# Patient Record
Sex: Female | Born: 1950 | Race: White | Hispanic: No | Marital: Single | Smoking: Never smoker
Health system: Southern US, Community
[De-identification: ages and names within clinical notes are randomized; demographics above are authoritative.]

## PROBLEM LIST (undated history)

## (undated) DIAGNOSIS — C801 Malignant (primary) neoplasm, unspecified: Secondary | ICD-10-CM

## (undated) DIAGNOSIS — A419 Sepsis, unspecified organism: Secondary | ICD-10-CM

## (undated) HISTORY — PX: CHOLECYSTECTOMY: SHX55

---

## 2016-03-31 ENCOUNTER — Emergency Department (HOSPITAL_COMMUNITY): Payer: Medicare Other

## 2016-03-31 ENCOUNTER — Inpatient Hospital Stay (HOSPITAL_COMMUNITY)
Admission: EM | Admit: 2016-03-31 | Discharge: 2016-04-05 | DRG: 690 | Disposition: A | Discharge status: Home / Self Care | Payer: Medicare Other | Attending: Family Medicine | Admitting: Family Medicine

## 2016-03-31 ENCOUNTER — Encounter (HOSPITAL_COMMUNITY): Payer: Self-pay

## 2016-03-31 DIAGNOSIS — N390 Urinary tract infection, site not specified: Principal | ICD-10-CM | POA: Diagnosis present

## 2016-03-31 DIAGNOSIS — M21371 Foot drop, right foot: Secondary | ICD-10-CM | POA: Diagnosis present

## 2016-03-31 DIAGNOSIS — A499 Bacterial infection, unspecified: Secondary | ICD-10-CM

## 2016-03-31 DIAGNOSIS — N189 Chronic kidney disease, unspecified: Secondary | ICD-10-CM | POA: Diagnosis present

## 2016-03-31 DIAGNOSIS — M549 Dorsalgia, unspecified: Secondary | ICD-10-CM | POA: Diagnosis present

## 2016-03-31 DIAGNOSIS — Y842 Radiological procedure and radiotherapy as the cause of abnormal reaction of the patient, or of later complication, without mention of misadventure at the time of the procedure: Secondary | ICD-10-CM | POA: Diagnosis present

## 2016-03-31 DIAGNOSIS — Z905 Acquired absence of kidney: Secondary | ICD-10-CM

## 2016-03-31 DIAGNOSIS — N131 Hydronephrosis with ureteral stricture, not elsewhere classified: Secondary | ICD-10-CM | POA: Diagnosis present

## 2016-03-31 DIAGNOSIS — N3000 Acute cystitis without hematuria: Secondary | ICD-10-CM | POA: Diagnosis not present

## 2016-03-31 DIAGNOSIS — Z936 Other artificial openings of urinary tract status: Secondary | ICD-10-CM

## 2016-03-31 DIAGNOSIS — Z1639 Resistance to other specified antimicrobial drug: Secondary | ICD-10-CM | POA: Diagnosis present

## 2016-03-31 DIAGNOSIS — B952 Enterococcus as the cause of diseases classified elsewhere: Secondary | ICD-10-CM | POA: Diagnosis present

## 2016-03-31 DIAGNOSIS — Z886 Allergy status to analgesic agent status: Secondary | ICD-10-CM

## 2016-03-31 DIAGNOSIS — R262 Difficulty in walking, not elsewhere classified: Secondary | ICD-10-CM

## 2016-03-31 DIAGNOSIS — Z1621 Resistance to vancomycin: Secondary | ICD-10-CM

## 2016-03-31 DIAGNOSIS — D649 Anemia, unspecified: Secondary | ICD-10-CM | POA: Diagnosis not present

## 2016-03-31 DIAGNOSIS — N1330 Unspecified hydronephrosis: Secondary | ICD-10-CM

## 2016-03-31 DIAGNOSIS — I959 Hypotension, unspecified: Secondary | ICD-10-CM | POA: Diagnosis present

## 2016-03-31 DIAGNOSIS — L8990 Pressure ulcer of unspecified site, unspecified stage: Secondary | ICD-10-CM | POA: Insufficient documentation

## 2016-03-31 DIAGNOSIS — Z923 Personal history of irradiation: Secondary | ICD-10-CM

## 2016-03-31 DIAGNOSIS — D631 Anemia in chronic kidney disease: Secondary | ICD-10-CM | POA: Diagnosis present

## 2016-03-31 DIAGNOSIS — A491 Streptococcal infection, unspecified site: Secondary | ICD-10-CM

## 2016-03-31 DIAGNOSIS — IMO0002 Reserved for concepts with insufficient information to code with codable children: Secondary | ICD-10-CM

## 2016-03-31 DIAGNOSIS — Z885 Allergy status to narcotic agent status: Secondary | ICD-10-CM

## 2016-03-31 DIAGNOSIS — Z9221 Personal history of antineoplastic chemotherapy: Secondary | ICD-10-CM

## 2016-03-31 DIAGNOSIS — G8929 Other chronic pain: Secondary | ICD-10-CM | POA: Diagnosis present

## 2016-03-31 DIAGNOSIS — Z882 Allergy status to sulfonamides status: Secondary | ICD-10-CM

## 2016-03-31 DIAGNOSIS — F419 Anxiety disorder, unspecified: Secondary | ICD-10-CM | POA: Diagnosis present

## 2016-03-31 DIAGNOSIS — Z8541 Personal history of malignant neoplasm of cervix uteri: Secondary | ICD-10-CM

## 2016-03-31 HISTORY — DX: Malignant (primary) neoplasm, unspecified: C80.1

## 2016-03-31 HISTORY — DX: Sepsis, unspecified organism: A41.9

## 2016-03-31 LAB — CBC WITH DIFFERENTIAL/PLATELET
BASOS PCT: 0 %
Basophils Absolute: 0 10*3/uL (ref 0.0–0.1)
Eosinophils Absolute: 0.4 10*3/uL (ref 0.0–0.7)
Eosinophils Relative: 4 %
HEMATOCRIT: 28.6 % — AB (ref 36.0–46.0)
HEMOGLOBIN: 8.8 g/dL — AB (ref 12.0–15.0)
LYMPHS ABS: 3.9 10*3/uL (ref 0.7–4.0)
Lymphocytes Relative: 38 %
MCH: 29.5 pg (ref 26.0–34.0)
MCHC: 30.8 g/dL (ref 30.0–36.0)
MCV: 96 fL (ref 78.0–100.0)
MONOS PCT: 9 %
Monocytes Absolute: 1 10*3/uL (ref 0.1–1.0)
NEUTROS ABS: 5 10*3/uL (ref 1.7–7.7)
NEUTROS PCT: 49 %
Platelets: 219 10*3/uL (ref 150–400)
RBC: 2.98 MIL/uL — AB (ref 3.87–5.11)
RDW: 14.6 % (ref 11.5–15.5)
WBC: 10.3 10*3/uL (ref 4.0–10.5)

## 2016-03-31 LAB — BASIC METABOLIC PANEL
Anion gap: 7 (ref 5–15)
BUN: 18 mg/dL (ref 6–20)
CHLORIDE: 105 mmol/L (ref 101–111)
CO2: 25 mmol/L (ref 22–32)
CREATININE: 1.7 mg/dL — AB (ref 0.44–1.00)
Calcium: 9.4 mg/dL (ref 8.9–10.3)
GFR calc non Af Amer: 30 mL/min — ABNORMAL LOW (ref >60)
GFR, EST AFRICAN AMERICAN: 35 mL/min — AB (ref >60)
Glucose, Bld: 78 mg/dL (ref 65–99)
POTASSIUM: 4.3 mmol/L (ref 3.5–5.1)
Sodium: 137 mmol/L (ref 135–145)

## 2016-03-31 LAB — URINE MICROSCOPIC-ADD ON: Squamous Epithelial / LPF: NONE SEEN

## 2016-03-31 LAB — I-STAT CG4 LACTIC ACID, ED
LACTIC ACID, VENOUS: 1.89 mmol/L (ref 0.5–1.9)
Lactic Acid, Venous: 1.54 mmol/L (ref 0.5–1.9)

## 2016-03-31 LAB — URINALYSIS, ROUTINE W REFLEX MICROSCOPIC
Bilirubin Urine: NEGATIVE
Glucose, UA: NEGATIVE mg/dL
Ketones, ur: NEGATIVE mg/dL
Nitrite: POSITIVE — AB
Protein, ur: 30 mg/dL — AB
SPECIFIC GRAVITY, URINE: 1.006 (ref 1.005–1.030)
pH: 6 (ref 5.0–8.0)

## 2016-03-31 MED ORDER — MIRTAZAPINE 15 MG PO TABS
15.0000 mg | ORAL_TABLET | Freq: Every day | ORAL | Status: DC
  Administered 2016-03-31 – 2016-04-03 (×4): 15 mg via ORAL
  Filled 2016-03-31 (×4): qty 1

## 2016-03-31 MED ORDER — OXYCODONE HCL 5 MG PO TABS
5.0000 mg | ORAL_TABLET | ORAL | Status: DC | PRN
  Administered 2016-03-31 – 2016-04-02 (×5): 5 mg via ORAL
  Filled 2016-03-31 (×5): qty 1

## 2016-03-31 MED ORDER — VITAMIN B-12 100 MCG PO TABS
100.0000 ug | ORAL_TABLET | Freq: Every day | ORAL | Status: DC
  Administered 2016-04-01 – 2016-04-05 (×5): 100 ug via ORAL
  Filled 2016-03-31 (×6): qty 1

## 2016-03-31 MED ORDER — ACETAMINOPHEN 650 MG RE SUPP: 650.0000 mg | Freq: Four times a day (QID) | RECTAL | Status: DC | PRN

## 2016-03-31 MED ORDER — GABAPENTIN 100 MG PO CAPS
200.0000 mg | ORAL_CAPSULE | Freq: Two times a day (BID) | ORAL | Status: DC
  Administered 2016-03-31 – 2016-04-05 (×10): 200 mg via ORAL
  Filled 2016-03-31 (×11): qty 2

## 2016-03-31 MED ORDER — HEPARIN SODIUM (PORCINE) 5000 UNIT/ML IJ SOLN
5000.0000 [IU] | Freq: Three times a day (TID) | INTRAMUSCULAR | Status: DC
  Administered 2016-03-31 – 2016-04-05 (×11): 5000 [IU] via SUBCUTANEOUS
  Filled 2016-03-31 (×12): qty 1

## 2016-03-31 MED ORDER — SODIUM CHLORIDE 0.9 % IV SOLN
INTRAVENOUS | Status: AC
  Administered 2016-03-31: 1000 mL via INTRAVENOUS
  Administered 2016-04-01: 05:00:00 via INTRAVENOUS

## 2016-03-31 MED ORDER — ALPRAZOLAM 0.5 MG PO TABS: 0.5000 mg | ORAL_TABLET | Freq: Three times a day (TID) | ORAL | Status: DC | PRN

## 2016-03-31 MED ORDER — FERROUS SULFATE 325 (65 FE) MG PO TABS
325.0000 mg | ORAL_TABLET | Freq: Every day | ORAL | Status: DC
  Administered 2016-04-01 – 2016-04-05 (×5): 325 mg via ORAL
  Filled 2016-03-31 (×6): qty 1

## 2016-03-31 MED ORDER — TEMAZEPAM 15 MG PO CAPS
30.0000 mg | ORAL_CAPSULE | Freq: Every day | ORAL | Status: DC
  Administered 2016-03-31 – 2016-04-04 (×5): 30 mg via ORAL
  Filled 2016-03-31 (×5): qty 2

## 2016-03-31 MED ORDER — SODIUM CHLORIDE 0.9% FLUSH
10.0000 mL | INTRAVENOUS | Status: DC | PRN
  Administered 2016-04-01 – 2016-04-04 (×2): 10 mL
  Filled 2016-03-31 (×2): qty 40

## 2016-03-31 MED ORDER — LOPERAMIDE HCL 2 MG PO CAPS
4.0000 mg | ORAL_CAPSULE | Freq: Three times a day (TID) | ORAL | Status: DC
  Administered 2016-03-31 – 2016-04-05 (×14): 4 mg via ORAL
  Filled 2016-03-31 (×17): qty 2

## 2016-03-31 MED ORDER — DEXTROSE 5 % IV SOLN
1.0000 g | Freq: Once | INTRAVENOUS | Status: AC
  Administered 2016-03-31: 1 g via INTRAVENOUS
  Filled 2016-03-31: qty 10

## 2016-03-31 MED ORDER — DEXTROSE 5 % IV SOLN
1.0000 g | INTRAVENOUS | Status: DC
  Administered 2016-04-01: 1 g via INTRAVENOUS
  Filled 2016-03-31 (×2): qty 10

## 2016-03-31 MED ORDER — SODIUM CHLORIDE 0.9 % IV BOLUS (SEPSIS)
1000.0000 mL | Freq: Once | INTRAVENOUS | Status: AC
Start: 2016-03-31 — End: 2016-04-01
  Administered 2016-03-31: 1000 mL via INTRAVENOUS

## 2016-03-31 MED ORDER — SODIUM BICARBONATE 650 MG PO TABS: 1300.0000 mg | ORAL_TABLET | Freq: Three times a day (TID) | ORAL | Status: DC

## 2016-03-31 MED ORDER — DIPHENOXYLATE-ATROPINE 2.5-0.025 MG PO TABS
1.0000 | ORAL_TABLET | Freq: Four times a day (QID) | ORAL | Status: DC | PRN
  Administered 2016-03-31 – 2016-04-05 (×4): 1 via ORAL
  Filled 2016-03-31 (×4): qty 1

## 2016-03-31 MED ORDER — ACETAMINOPHEN 325 MG PO TABS: 650.0000 mg | ORAL_TABLET | Freq: Four times a day (QID) | ORAL | Status: DC | PRN

## 2016-03-31 NOTE — ED Triage Notes (Signed)
Patient here with 1 day of cloudy urine and thinks this may be the start up of sepsis again. Daughter reports lengthy recent hospitalization for urosepsis. Patient alert and oriented, denies pain, no fever, no chiils

## 2016-03-31 NOTE — ED Provider Notes (Signed)
[facility] DEPT Provider Note   CSN: [id number] Arrival date & time: 03/31/16  1151     History   Chief Complaint Chief Complaint  Patient presents with  . cloudy urine/ hx of sepsis    HPI Rhonda Price is a 65 y.o. female.  The history is provided by the patient and medical records.    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 58 of history is provided by patient's daughter. Daughter reports that patient was hospitalized in [place] for about 2 months secondary to urosepsis. Patient received radiation and chemotherapy in the 1980s for cervical cancer. Subsequently, she has significant radiation damage to her urinary tract and distal colon. Because of this, she unfortunately had to undergo right nephrectomy and required stenting from the left kidney. During the hospital stay, her condition worsened and she ultimately required nephrostomy placement in the left kidney. Daughter reports she was just released from the hospital on 03/04/2016. Daughter relocated patient to [place] from [place] about one week ago. She did have her follow-up with a primary care doctor, however has not gotten her into see a urologist yet in the area. Daughter states she first started noticing the cloudy urine yesterday in the collection bag. She started her on ciprofloxacin which she was given a prescription of prior to leaving [place]. States today, cloudiness seems worse. Patient has not had any fever, nausea, or vomiting. Daughter does report that she appears somewhat weaker than normal. She has been eating and drinking regularly.  Patient does have port in chest wall.  Daughter states nephrostomy tubing is scheduled to be changed Q3 months, was just changed about a month ago while in hospital.  Past Medical History:  Diagnosis Date  . Cancer ([place])     . Sepsis ([place])     There are no active problems to display for this patient.   History reviewed. No pertinent surgical history.  OB History    No data available       Home Medications    Prior to Admission medications   Not on File    Family History No family history on file.  Social History Social History  Substance Use Topics  . Smoking status: Never Smoker  . Smokeless tobacco: Never Used  . Alcohol use Not on file     Allergies   Aspirin; Nsaids; Sulfa antibiotics; and Codeine   Review of Systems Review of Systems  Genitourinary:       Cloudy urine  All other systems reviewed and are negative.    Physical Exam Updated Vital Signs BP 105/66 (BP Location: Right Arm)   Pulse 89   Temp 98.4 F (36.9 C) (Oral)   Resp 18   Wt 51.3 kg   SpO2 99%   Physical Exam  Constitutional: She is oriented to person, place, and time. She appears well-developed and well-nourished.  Appears older than stated age, no distress, frail  HENT:  Head: Normocephalic and atraumatic.  Mouth/Throat: Oropharynx is clear and moist.  Eyes: Conjunctivae and EOM are normal. Pupils are equal, round, and reactive to light.  Neck: Normal range of motion.  Cardiovascular: Normal rate, regular rhythm and normal heart sounds.   Pulmonary/Chest: Effort normal and breath sounds normal. No respiratory distress. She has no wheezes.  Abdominal: Soft. Bowel sounds are normal. There is no tenderness. There is no rigidity and  no guarding.  Left nephrostomy tube in place; urine in tubing is very cloudy and almost milky appearing with large amount of sediment; urine in drainage bag appears yellow with sediment Nephrostomy site itself appears clean  Musculoskeletal: Normal range of motion.  Neurological: She is alert and oriented to person, place, and time.  Skin: Skin is warm and dry.  Psychiatric: She has a normal mood and affect.  Nursing note and vitals reviewed.    ED Treatments /  Results  Labs (all labs ordered are listed, but only abnormal results are displayed) Labs Reviewed  CBC WITH DIFFERENTIAL/PLATELET - Abnormal; Notable for the following:       Result Value   RBC 2.98 (*)    Hemoglobin 8.8 (*)    HCT 28.6 (*)    All other components within normal limits  BASIC METABOLIC PANEL - Abnormal; Notable for the following:    Creatinine, Ser 1.70 (*)    GFR calc non Af Amer 30 (*)    GFR calc Af Amer 35 (*)    All other components within normal limits  URINALYSIS, ROUTINE W REFLEX MICROSCOPIC (NOT AT [facility]) - Abnormal; Notable for the following:    APPearance CLOUDY (*)    Hgb urine dipstick SMALL (*)    Protein, ur 30 (*)    Nitrite POSITIVE (*)    Leukocytes, UA LARGE (*)    All other components within normal limits  URINE MICROSCOPIC-ADD ON - Abnormal; Notable for the following:    Bacteria, UA MANY (*)    All other components within normal limits  URINE CULTURE  CULTURE, BLOOD (ROUTINE X 2)  CULTURE, BLOOD (ROUTINE X 2)  I-STAT CG4 LACTIC ACID, ED  I-STAT CG4 LACTIC ACID, ED    EKG  EKG Interpretation None       Radiology Dg Chest 2 View  Result Date: 03/31/2016 CLINICAL DATA:  Patient with bloody urine. Concern for the possibility of urinary tract infection. EXAM: CHEST  2 VIEW COMPARISON:  None. FINDINGS: Port-A-Cath is present with tip projecting over the superior vena cava. Cardiac contours upper limits of normal. No large area of pulmonary consolidation. No pleural effusion or pneumothorax. Thoracic spine degenerative changes. IMPRESSION: No active cardiopulmonary disease. Electronically Signed   By: Lovey Newcomer M.D.   On: 03/31/2016 13:05    Procedures Procedures (including critical care time)  Medications Ordered in ED Medications - No data to display   Initial Impression / Assessment and Plan / ED Course  I have reviewed the triage vital signs and the nursing notes.  Pertinent labs & imaging results that were available during  my care of the patient were reviewed by me and considered in my medical decision making (see chart for details).  Clinical Course    65 y.o. F here with cloudy urine.  She has complex urologic history including right nephrectomy, left nephrostomy with recent two-month admission for urosepsis in [place].  Brought here 1 week ago to live with daughter who is POA.  Here patient is afebrile and nontoxic. She does appear older than her stated age and is frail. Her nephrostomy site appears clean, however there is very cloudy urine with large amount of sediment in the tubing and in the collection bag. Lactic acid and WBC count are normal.  SrCr 1.70, BUN normal.  No old values for comparison.  Urine here is nitrite + with many bacteria.  Blood and urine culture pending.  Patient given IVF and rocephin.  Case discussed  with family medicine teaching service, they will admit for ongoing care.  Have also spoken with urology, Dr. Diona Fanti-- he will come by and see patient later today to provide any recommendations which is much appreciated.  Final Clinical Impressions(s) / ED Diagnoses   Final diagnoses:  Urinary tract bacterial infections  Hx of nephrostomy  History of nephrectomy    New Prescriptions New Prescriptions   No medications on file     Larene Pickett, PA-C 03/31/16 1532    Daleen Bo, MD 03/31/16 Mena, MD 03/31/16 1552

## 2016-03-31 NOTE — ED Provider Notes (Signed)
  Face-to-face evaluation   History: Patient with mild weakness, and ill feeling for about a day. Her daughter became concerned because her urine output from the left nephrostomy tube was cloudy. No fever, vomiting. Complicated recent history. She has a single kidney.  Physical exam: Frail, elderly-appearing female. Heart regular rate and rhythm, no murmur. The pressure borderline low. Abdomen soft, nontender. Nephrostomy site left flank appears normal.  Medical screening examination/treatment/procedure(s) were conducted as a shared visit with non-physician practitioner(s) and myself.  I personally evaluated the patient during the encounter   Daleen Bo, MD 03/31/16 [phone]

## 2016-03-31 NOTE — Consult Note (Signed)
Urology Consult   Physician requesting consult: Sela Hua, MD  Reason for consult: Left nephrostomy tube with possible UTI  History of Present Illness: Rhonda Price is a 65 y.o. female with a long urologic history.  Initially, she underwent radiotherapy for cervical cancer in the 1970s.  She had significant complications, both from a bowel standpoint as well as ureteral obstruction.  She currently has a left percutaneous nephrostomy tube that, by report, was initially placed in the spring of 2017.  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  To manage her left ureteral stricture, she had recurrent stent placement on that side, until earlier this year, at which time a nephrostomy tube was placed.  Apparently, the last time this was changed was in late September 2017.  The patient was recently discharged in late October from a 2-1/2 month hospitalization in [place] for treatment of urosepsis.  Apparently, she was dialyzed for short period of time.  She recently moved to [place] to be near family.  Her daughter notes that she has been a little weaker than usual over the past day or 2, had a low-grade fever last night, and noted that the urine in the nephrostomy tubing appeared slightly cloudy.  She was brought to the hospital for further evaluation and management.  The patient currently denies any hematuria, flank pain, nausea or vomiting.  The patient's daughter states that she had several imaging studies while in the hospital in [place].  She does not have access to any of those or the discharge summary at this time.    Past Medical History:  Diagnosis Date  . Cancer ([place])   . Sepsis ([place])     History reviewed. No pertinent surgical history.   Current Hospital Medications: Scheduled Meds: . [START ON 04/01/2016] cefTRIAXone (ROCEPHIN)  IV  1 g Intravenous Q24H  . [START ON 04/01/2016] ferrous sulfate   325 mg Oral Q breakfast  . gabapentin  200 mg Oral BID  . heparin  5,000 Units Subcutaneous Q8H  . loperamide  4 mg Oral TID  . mirtazapine  15 mg Oral QHS  . temazepam  30 mg Oral QHS  . [START ON 04/01/2016] vitamin B-12  100 mcg Oral Daily   Continuous Infusions: . sodium chloride     PRN Meds:.acetaminophen **OR** acetaminophen, diphenoxylate-atropine, oxyCODONE, sodium chloride flush  Allergies:  Allergies  Allergen Reactions  . Aspirin Anaphylaxis  . Nsaids Anaphylaxis  . Sulfa Antibiotics Anaphylaxis  . Codeine Hives    No family history on file.  Social History:  reports that she has never smoked. She has never used smokeless tobacco. Her alcohol and drug histories are not on file.  ROS: A complete review of systems was performed.  All systems are negative except for pertinent findings as noted.  Physical Exam:  Vital signs in last 24 hours: Temp:  [98.4 F (36.9 C)] 98.4 F (36.9 C) (11/26 1157) Pulse Rate:  [72-89] 74 (11/26 1630) Resp:  [17-18] 17 (11/26 1545) BP: (98-113)/(52-66) 110/54 (11/26 1630) SpO2:  [98 %-99 %] 99 % (11/26 1630) Weight:  [51.3 kg (113 lb)] 51.3 kg (113 lb) (11/26 1159) General:  Alert and oriented, No acute distress HEENT: Normocephalic, atraumatic Neck: No JVD or lymphadenopathy Cardiovascular: Regular rate and rhythm Lungs: Clear bilaterally Abdomen: Soft, nontender, nondistended, no abdominal masses.  Nephrostomy tube is properly positioned in the left posterior flank area.  There is no tenderness surrounding this.  There is no purulent drainage. Back: No CVA tenderness Extremities: No edema Neurologic: Grossly intact  Laboratory Data:   Recent Labs  03/31/16 1340  WBC 10.3  HGB 8.8*  HCT 28.6*  PLT 219     Recent Labs  03/31/16 1340  NA 137  K 4.3  CL 105  GLUCOSE 78  BUN 18  CALCIUM 9.4  CREATININE 1.70*     Results for orders placed or performed during the hospital encounter of 03/31/16 (from the past  24 hour(s))  Urinalysis, Routine w reflex microscopic (not at [facility])     Status: Abnormal   Collection Time: 03/31/16 12:22 PM  Result Value Ref Range   Color, Urine YELLOW YELLOW   APPearance CLOUDY (A) CLEAR   Specific Gravity, Urine 1.006 1.005 - 1.030   pH 6.0 5.0 - 8.0   Glucose, UA NEGATIVE NEGATIVE mg/dL   Hgb urine dipstick SMALL (A) NEGATIVE   Bilirubin Urine NEGATIVE NEGATIVE   Ketones, ur NEGATIVE NEGATIVE mg/dL   Protein, ur 30 (A) NEGATIVE mg/dL   Nitrite POSITIVE (A) NEGATIVE   Leukocytes, UA LARGE (A) NEGATIVE  Urine microscopic-add on     Status: Abnormal   Collection Time: 03/31/16 12:22 PM  Result Value Ref Range   Squamous Epithelial / LPF NONE SEEN NONE SEEN   WBC, UA TOO NUMEROUS TO COUNT 0 - 5 WBC/hpf   RBC / HPF 6-30 0 - 5 RBC/hpf   Bacteria, UA MANY (A) NONE SEEN  CBC with Differential     Status: Abnormal   Collection Time: 03/31/16  1:40 PM  Result Value Ref Range   WBC 10.3 4.0 - 10.5 K/uL   RBC 2.98 (L) 3.87 - 5.11 MIL/uL   Hemoglobin 8.8 (L) 12.0 - 15.0 g/dL   HCT 28.6 (L) 36.0 - 46.0 %   MCV 96.0 78.0 - 100.0 fL   MCH 29.5 26.0 - 34.0 pg   MCHC 30.8 30.0 - 36.0 g/dL   RDW 14.6 11.5 - 15.5 %   Platelets 219 150 - 400 K/uL   Neutrophils Relative % 49 %   Neutro Abs 5.0 1.7 - 7.7 K/uL   Lymphocytes Relative 38 %   Lymphs Abs 3.9 0.7 - 4.0 K/uL   Monocytes Relative 9 %   Monocytes Absolute 1.0 0.1 - 1.0 K/uL   Eosinophils Relative 4 %   Eosinophils Absolute 0.4 0.0 - 0.7 K/uL   Basophils Relative 0 %   Basophils Absolute 0.0 0.0 - 0.1 K/uL  Basic metabolic panel     Status: Abnormal   Collection Time: 03/31/16  1:40 PM  Result Value Ref Range   Sodium 137 135 - 145 mmol/L   Potassium 4.3 3.5 - 5.1 mmol/L   Chloride 105 101 - 111 mmol/L   CO2 25 22 - 32 mmol/L   Glucose, Bld 78 65 - 99 mg/dL   BUN 18 6 - 20 mg/dL   Creatinine, Ser 1.70 (H) 0.44 - 1.00 mg/dL   Calcium 9.4 8.9 - 10.3 mg/dL   GFR calc non Af Amer 30 (L) >60 mL/min   GFR  calc Af Amer 35 (L) >60 mL/min   Anion gap 7 5 - 15  Blood culture (routine x 2)     Status: None (Preliminary result)   Collection Time: 03/31/16  1:40 PM  Result Value Ref Range   Specimen Description BLOOD    Special Requests BOTTLES DRAWN AEROBIC AND ANAEROBIC  5CC PORT    Culture PENDING  Report Status PENDING   I-Stat CG4 Lactic Acid, ED     Status: None   Collection Time: 03/31/16  2:01 PM  Result Value Ref Range   Lactic Acid, Venous 1.54 0.5 - 1.9 mmol/L  Urine culture     Status: None (Preliminary result)   Collection Time: 03/31/16  2:25 PM  Result Value Ref Range   Specimen Description URINE, RANDOM    Special Requests BAG PED    Culture PENDING    Report Status PENDING   I-Stat CG4 Lactic Acid, ED     Status: None   Collection Time: 03/31/16  3:47 PM  Result Value Ref Range   Lactic Acid, Venous 1.89 0.5 - 1.9 mmol/L   Recent Results (from the past 240 hour(s))  Blood culture (routine x 2)     Status: None (Preliminary result)   Collection Time: 03/31/16  1:40 PM  Result Value Ref Range Status   Specimen Description BLOOD  Final   Special Requests BOTTLES DRAWN AEROBIC AND ANAEROBIC  5CC PORT  Final   Culture PENDING  Incomplete   Report Status PENDING  Incomplete  Urine culture     Status: None (Preliminary result)   Collection Time: 03/31/16  2:25 PM  Result Value Ref Range Status   Specimen Description URINE, RANDOM  Final   Special Requests BAG PED  Final   Culture PENDING  Incomplete   Report Status PENDING  Incomplete    Renal Function:  Recent Labs  03/31/16 1340  CREATININE 1.70*   CrCl cannot be calculated (Unknown ideal weight.).  Radiologic Imaging: Dg Chest 2 View  Result Date: 03/31/2016 CLINICAL DATA:  Patient with bloody urine. Concern for the possibility of urinary tract infection. EXAM: CHEST  2 VIEW COMPARISON:  None. FINDINGS: Port-A-Cath is present with tip projecting over the superior vena cava. Cardiac contours upper limits  of normal. No large area of pulmonary consolidation. No pleural effusion or pneumothorax. Thoracic spine degenerative changes. IMPRESSION: No active cardiopulmonary disease. Electronically Signed   By: Lovey Newcomer M.D.   On: 03/31/2016 13:05    I independently reviewed the above imaging studies.  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  Possible urinary tract infection.  The patient has a recent history of urosepsis, treated during a 2-1/2 month long hospitalization in [place], recently discharged in late October.  It is important to make sure the patient does not have any renal calculi or other renal abnormalities that would predispose her to have these infections.  Plan:  1.  I have asked the patient's daughter to obtain radiographic imaging studies done while in her hospital stay in [place].  2.  It has been approximately 2 months since she had her nephrostomy tube changed-I would strongly recommend that this be done during her hospitalization here.  I will have that procedure ordered.  3.  I spoke with the patient and her daughter about proper antibiotic management of pyuria.  With her having her left kidney drained externally, there will always be bacteria in the tubing/urine, similar to a Foley catheter being in the bladder for several days.  It will be judicious to not over treat pyuria, as this will obviously cause more resistant bacteria to grow out.  4.  I would treat her current pyuria with adequate antibiotics tailored to the bacterial species/sensitivities  5.  She will need occasional follow-up in our office.-We will get that arranged.  6.  Imaging  of her renal collecting system will be adequately  done, hopefully during her interventional radiology visit.  45 minutes were spent with the patient face-to-face, as well as speaking with the patient's daughter.  6.

## 2016-03-31 NOTE — H&P (Signed)
[facility] Admission History and Physical Service Pager: [phone]  Patient name: Rhonda Price Medical record number: [id number] Date of birth: 08/20/1950 Age: 65 y.o. Gender: female  Primary Care Provider: Thurman Coyer, MD Consultants: Urology Code Status: Full  Chief Complaint: "Milky/cloudy urine"  Assessment and Plan: Taina Dysert is a 65 y.o. female presenting with UTI. PMH is significant for hx cervical cancer in the 70s, radiation colitis w/ chronic diarrhea, chronic back pain w/ lumbar rods, hx R nephrectomy, L nephrostomy tube.  Urinary Tract Infection: Pt has a significant urologic history 2/2 to urinary tract damage after receiving radiation for cervical cancer in the 70s. She is s/p R nephrectomy after numerous infections. She currently has a nephrostomy tube in place on the L. She was just recently hospitalized for urosepsis. She has been having cloudy urine since yesterday, low grade fever to 99, and "has been feeling strange". In the ED, she was afebrile with a normal HR. WBC 10.8. Pt not meeting SIRS criteria. Lactic acid was 1.54 (although Pt taking sodium bicarb at home). UA was consistent with infection with many bacteria, large LE, positive nitrites, TNTC WBCs.  - Admit to med-surg under observation status, attending Dr. Gwendlyn Deutscher. - Urology consulted, appreciate recommendations - Continue Ceftriaxone - Blood and urine cultures pending - Monitor I/Os - Repeat AM CBC and BMET - Vitals per unit routine - Tylenol for fever  Hypotension: Pt with a BP to 98/52 in the ED, improved to 110/54 after a 1L NS bolus. - Will monitor blood pressures closely and will give another bolus if needed - MIVFs at 67ml/hr  Elevated Creatinine in CKD: Cr 1.70 in the ED. No baseline for comparison. Required HD x 3 at last hospitalization for acute renal failure. 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 - Continue home meds: Lomotil 1 tablet qid prn, and Imodium 4mg  tid  Normocytic Anemia: Denies hematochezia or hematuria. Likely related to chronic kidney disease. - Continue home meds: ferrous sulfate 325mg  qd and Vitamin B12 185mcg daily - Repeat AM CBC  Chronic Back Pain: s/p lumbar spinal surgery in 2015 with lumbar rods placed. 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 - Continue home meds: Gabapentin 200mg  bid, Oxycodone 5mg  q4hrs prn  Anxiety/Insomia: - Continue home meds: Temazepam qhs and Remeron 15mg  qhs  - Holding home Xanax because she is already taking Temazepam  FEN/GI: Regular diet Prophylaxis: Heparin sq  Disposition: Admit to med-surg under observation status. Anticipate discharge home on 11/27 or 11/28 pending urology recommendations.  History of Present Illness:  Rhonda Price is a 65 y.o. female presenting with milky/cloudy urine and not feeling like herself starting this morning. History is provided by the daughter. In the 70s, Pt had cervical cancer. She had radiology pellets placed for treatment, and this lead to chronic problems with her urinary tract and bowel. 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 The radiation also caused her to have problems with her urinary tract. She had a ureter stent in her right kidney (2011) and had too many infections and too much kidney damage. Her right kidney was then removed in 2014. She had a stent placed in her left kidney this year. A few months after the stent was placed, she was hospitalized for 2.5 months for urosepsis.  She also went into acute renal failure and had to receive HD x 3. In the hospital, they initially replaced the sten, but then decided to remove the stent and place a nephrostomy tube instead. At that hospitalization, she also had her gallbladder removed. She went to inpatient  rehab and then moved from [place] to [place] to be closer to her daughter.  Yesterday morning, her daughter noticed that her nephrostomy tube had sediment in it and she noticed that her urine started looking milky. She had a temperature to 83F. Patient states started feeling "strange" this morning. She denies feeling feverish or having chills. No nausea, no vomiting. Today, she also had some pain of her left flank. It felt like "pulling stitches". She has no other complaints.  In the ED, Pt was afebrile with a normal HR. She had a low BP to 98/52 and was given a 1L bolus of NS. Her BP improved to 113/53. Her labs were significant for Cr 1.70 (no baseline for comparison), WBC 10.3, Hgb 8.8. Lactic acid 1.54. UA with many bacteria, small Hgb, lrg LE, positive nitrites, 30 protein, and TNTC WBCs. Blood and urine cultures were performed. Pt received CTX x 1. Urology was consulted by the ED. She was admitted for further management.  Review Of Systems: Per HPI with the following additions: see below  Review of Systems  Constitutional: Positive for malaise/fatigue. Negative for chills and fever.  HENT: Negative for congestion, hearing loss and sore throat.   Eyes: Positive for blurred vision. Negative for double vision.  Respiratory: Negative for cough and shortness of breath.   Cardiovascular: Negative for chest pain and leg swelling.  Gastrointestinal: Positive for diarrhea. Negative for abdominal pain, constipation, nausea and vomiting.  Genitourinary: Positive for flank pain. Negative for hematuria.  Musculoskeletal: Positive for back pain.  Skin: Negative for rash.  Neurological: Negative for dizziness and headaches.    Patient Active Problem List   Diagnosis Date Noted  . UTI (urinary tract infection) 03/31/2016    Past Medical History: Past Medical History:  Diagnosis Date  . Cancer ([place])   . Sepsis [facility])     Past Surgical History: -Cholecystectomy- 2017 -Lumbar spine surgery-  2015 -Nephrectomy R- 2014 -Nephrostomy L- 2017 -Hysterectomy  Social History: Social History  Substance Use Topics  . Smoking status: Never Smoker  . Smokeless tobacco: Never Used  . Alcohol use Not on file   Additional social history: Just moved from [place] to be closer to daughter. Please also refer to relevant sections of EMR.  Family History: -No kidney problems run in the family -Daughter is healthy without any medical conditions  Allergies and Medications: Allergies  Allergen Reactions  . Aspirin Anaphylaxis  . Nsaids Anaphylaxis  . Sulfa Antibiotics Anaphylaxis  . Codeine Hives   No current facility-administered medications on file prior to encounter.    No current outpatient prescriptions on file prior to encounter.    Objective: BP (!) 113/53   Pulse 86   Temp 98.4 F (36.9 C) (Oral)   Resp 17   Wt 113 lb (51.3 kg)   SpO2 99%  Exam: General: Frail, tired-appearing, intermittently tearful, answers questions Eyes: PERRLA, EOMI ENTM: Nose normal, oropharynx clear, MMM Neck: Supple, no thyromegaly, no cervical lymphadenopathy Cardiovascular: RRR, no murmurs, 2+ DP pulses Respiratory: CTAB, normal work of breathing, no wheezes or crackles Gastrointestinal: +BS, soft, non-tender, non-distended, old well-healed scar from previous PEG tube present in LUQ GU: Left nephrostomy tube intact with no surrounding edema, urine  in collecting bag is cloudy and yellow, sediment present in the nephrostomy tubing. MSK: No edema, warm and well-perfused Derm: No rashes or lesions Neuro: Awake, alert, oriented, +R foot drop, sensation intact bilaterally. Psych: Normal behavior, appropriate affect, occasionally tearful about being admitted.  Labs and Imaging: CBC BMET   Recent Labs Lab 03/31/16 1340  WBC 10.3  HGB 8.8*  HCT 28.6*  PLT 219    Recent Labs Lab 03/31/16 1340  NA 137  K 4.3  CL 105  CO2 25  BUN 18  CREATININE 1.70*  GLUCOSE 78  CALCIUM 9.4      CXR- neg  Sela Hua, MD 03/31/2016, 4:31 PM PGY-2, [place] Intern pager: [phone], text pages welcome

## 2016-03-31 NOTE — Progress Notes (Signed)
Cutina Deavila [id number] Admitted to [id number]: 03/31/2016 1930 Attending Provider: Kinnie Feil, MD    Rhonda Price is a 65 y.o. female patient admitted from ED awake, alert  & orientated  X 3,  Full Code, VSS - Blood pressure (!) 119/53, pulse 80, temperature 97.9 F (36.6 C), temperature source Oral, resp. rate 18, height 5\' 4"  (1.626 m), weight 51.3 kg (113 lb), SpO2 99 %., R/A, no c/o shortness of breath, no c/o chest pain, no distress noted. Non Tele placed.:g.   IV site WDL:  Left porta cath with a transparent dsg that's clean dry and intact.  Allergies:   Allergies  Allergen Reactions  . Aspirin Anaphylaxis  . Nsaids Anaphylaxis  . Sulfa Antibiotics Anaphylaxis  . Codeine Hives     Past Medical History:  Diagnosis Date  . Cancer ([place])   . Sepsis ([place])     History:  Will be obtained from the patient.  Pt orientation to unit, room and routine. Information packet given to patient/family and safety video watched.  Admission INP armband ID verified with patient/family, and in place. SR up x 2, fall risk assessment complete with Patient and family verbalizing understanding of risks associated with falls. Pt verbalizes an understanding of how to use the call bell and to call for help before getting out of bed.  Skin, clean-dry- intact without evidence of bruising, or skin tears.   No evidence of skin break down noted on exam, stage 1 to sacrum.    Will cont to monitor and assist as needed.  Lindalou Hose, RN 03/31/2016 8:32 PM

## 2016-04-01 ENCOUNTER — Encounter (HOSPITAL_COMMUNITY): Payer: Self-pay | Admitting: Interventional Radiology

## 2016-04-01 ENCOUNTER — Observation Stay (HOSPITAL_COMMUNITY): Payer: Medicare Other

## 2016-04-01 DIAGNOSIS — D631 Anemia in chronic kidney disease: Secondary | ICD-10-CM | POA: Diagnosis present

## 2016-04-01 DIAGNOSIS — N390 Urinary tract infection, site not specified: Secondary | ICD-10-CM | POA: Diagnosis present

## 2016-04-01 DIAGNOSIS — I959 Hypotension, unspecified: Secondary | ICD-10-CM | POA: Diagnosis present

## 2016-04-01 DIAGNOSIS — Z87448 Personal history of other diseases of urinary system: Secondary | ICD-10-CM | POA: Diagnosis not present

## 2016-04-01 DIAGNOSIS — G8929 Other chronic pain: Secondary | ICD-10-CM | POA: Diagnosis present

## 2016-04-01 DIAGNOSIS — Y842 Radiological procedure and radiotherapy as the cause of abnormal reaction of the patient, or of later complication, without mention of misadventure at the time of the procedure: Secondary | ICD-10-CM | POA: Diagnosis present

## 2016-04-01 DIAGNOSIS — Z1621 Resistance to vancomycin: Secondary | ICD-10-CM | POA: Diagnosis not present

## 2016-04-01 DIAGNOSIS — M549 Dorsalgia, unspecified: Secondary | ICD-10-CM | POA: Diagnosis present

## 2016-04-01 DIAGNOSIS — Z8541 Personal history of malignant neoplasm of cervix uteri: Secondary | ICD-10-CM | POA: Diagnosis not present

## 2016-04-01 DIAGNOSIS — B952 Enterococcus as the cause of diseases classified elsewhere: Secondary | ICD-10-CM | POA: Diagnosis present

## 2016-04-01 DIAGNOSIS — Z885 Allergy status to narcotic agent status: Secondary | ICD-10-CM | POA: Diagnosis not present

## 2016-04-01 DIAGNOSIS — M21371 Foot drop, right foot: Secondary | ICD-10-CM | POA: Diagnosis present

## 2016-04-01 DIAGNOSIS — Z1639 Resistance to other specified antimicrobial drug: Secondary | ICD-10-CM | POA: Diagnosis present

## 2016-04-01 DIAGNOSIS — A491 Streptococcal infection, unspecified site: Secondary | ICD-10-CM | POA: Diagnosis not present

## 2016-04-01 DIAGNOSIS — IMO0002 Reserved for concepts with insufficient information to code with codable children: Secondary | ICD-10-CM

## 2016-04-01 DIAGNOSIS — Z9221 Personal history of antineoplastic chemotherapy: Secondary | ICD-10-CM | POA: Diagnosis not present

## 2016-04-01 DIAGNOSIS — N3000 Acute cystitis without hematuria: Secondary | ICD-10-CM | POA: Diagnosis not present

## 2016-04-01 DIAGNOSIS — Z936 Other artificial openings of urinary tract status: Secondary | ICD-10-CM | POA: Diagnosis not present

## 2016-04-01 DIAGNOSIS — Z882 Allergy status to sulfonamides status: Secondary | ICD-10-CM | POA: Diagnosis not present

## 2016-04-01 DIAGNOSIS — N189 Chronic kidney disease, unspecified: Secondary | ICD-10-CM | POA: Diagnosis present

## 2016-04-01 DIAGNOSIS — Z905 Acquired absence of kidney: Secondary | ICD-10-CM | POA: Diagnosis not present

## 2016-04-01 DIAGNOSIS — Z923 Personal history of irradiation: Secondary | ICD-10-CM | POA: Diagnosis not present

## 2016-04-01 DIAGNOSIS — N131 Hydronephrosis with ureteral stricture, not elsewhere classified: Secondary | ICD-10-CM | POA: Diagnosis present

## 2016-04-01 DIAGNOSIS — F419 Anxiety disorder, unspecified: Secondary | ICD-10-CM | POA: Diagnosis present

## 2016-04-01 DIAGNOSIS — Z886 Allergy status to analgesic agent status: Secondary | ICD-10-CM | POA: Diagnosis not present

## 2016-04-01 DIAGNOSIS — L8990 Pressure ulcer of unspecified site, unspecified stage: Secondary | ICD-10-CM | POA: Diagnosis present

## 2016-04-01 HISTORY — PX: IR GENERIC HISTORICAL: IMG1180011

## 2016-04-01 LAB — BASIC METABOLIC PANEL
Anion gap: 5 (ref 5–15)
BUN: 19 mg/dL (ref 6–20)
CHLORIDE: 114 mmol/L — AB (ref 101–111)
CO2: 23 mmol/L (ref 22–32)
CREATININE: 1.67 mg/dL — AB (ref 0.44–1.00)
Calcium: 8.8 mg/dL — ABNORMAL LOW (ref 8.9–10.3)
GFR, EST AFRICAN AMERICAN: 36 mL/min — AB (ref >60)
GFR, EST NON AFRICAN AMERICAN: 31 mL/min — AB (ref >60)
Glucose, Bld: 86 mg/dL (ref 65–99)
POTASSIUM: 4.4 mmol/L (ref 3.5–5.1)
SODIUM: 142 mmol/L (ref 135–145)

## 2016-04-01 LAB — URINE CULTURE

## 2016-04-01 LAB — CBC
HCT: 26.3 % — ABNORMAL LOW (ref 36.0–46.0)
Hemoglobin: 8.2 g/dL — ABNORMAL LOW (ref 12.0–15.0)
MCH: 30.3 pg (ref 26.0–34.0)
MCHC: 31.2 g/dL (ref 30.0–36.0)
MCV: 97 fL (ref 78.0–100.0)
PLATELETS: 188 10*3/uL (ref 150–400)
RBC: 2.71 MIL/uL — ABNORMAL LOW (ref 3.87–5.11)
RDW: 15 % (ref 11.5–15.5)
WBC: 7.4 10*3/uL (ref 4.0–10.5)

## 2016-04-01 MED ORDER — LIDOCAINE HCL 1 % IJ SOLN
INTRAMUSCULAR | Status: DC | PRN
  Administered 2016-04-01: 5 mL

## 2016-04-01 MED ORDER — SODIUM CHLORIDE 0.9 % IV BOLUS (SEPSIS)
1000.0000 mL | Freq: Once | INTRAVENOUS | Status: AC
  Administered 2016-04-01: 1000 mL via INTRAVENOUS

## 2016-04-01 MED ORDER — LIDOCAINE HCL 1 % IJ SOLN
INTRAMUSCULAR | Status: AC
  Filled 2016-04-01: qty 20

## 2016-04-01 MED ORDER — SODIUM BICARBONATE 650 MG PO TABS
1300.0000 mg | ORAL_TABLET | Freq: Three times a day (TID) | ORAL | Status: DC
  Administered 2016-04-01 – 2016-04-05 (×13): 1300 mg via ORAL
  Filled 2016-04-01 (×13): qty 2

## 2016-04-01 MED ORDER — IOPAMIDOL (ISOVUE-300) INJECTION 61%
INTRAVENOUS | Status: AC
  Administered 2016-04-01: 15 mL
  Filled 2016-04-01: qty 50

## 2016-04-01 NOTE — Progress Notes (Signed)
Family Medicine Teaching Service Daily Progress Note Intern Pager: [phone]  Patient name: Rhonda Price Medical record number: [id number] Date of birth: 1951/02/11 Age: 65 y.o. Gender: female  Primary Care Provider: Thurman Coyer, MD Consultants: Urology Code Status: Full  Pt Overview and Major Events to Date:  11/26 admit for UTI-like symptoms 11/27 plan for nephrostomy tube change by IR  Assessment and Plan: Cortasia Khouri is a 65 y.o. female presenting with UTI. PMH is significant for hx cervical cancer in the 70s, radiation colitis w/ chronic diarrhea, chronic back pain w/ lumbar rods, hx R nephrectomy, L nephrostomy tube.  Likely Urinary Tract Infection: Significant urologic history 2/2 to urinary tract damage s/p radiation for cervical cancer in 1970s. S/p R nephrectomy, S/p L nephrostomy tube placed this year. P/w cloudy urine, sediment in neph tube, leukocytosis 10.8>>7.8, LA 1.54. UA with many bacteria, large LE, positive nitrites, TNTC WBCs. Cr 1.7>>1.67 -  Plan for nephrostomy tube change by IR today - Continue Day #2 Ceftriaxone (11/26 - ), will narrow based on UCx  - F/u BCx, UCx - Monitor I/Os - 900 mL UOP yesterday - Vitals per unit routine - Tylenol PRN fever - appreciate urology recs  Hypotension: Pt with a BP to 98/52 in the ED, improved to 110/54 after a 1L NS bolus. - 99-119/36-61 overnight.  This morning paged patient's BP low at 93 systolic, improved to [id number] s/p 1000 ml bolus.  - will repeat 1000 ml bolus and recheck pressure - Will monitor blood pressures closely after bolus - MIVFs at 73ml/hr  Elevated Creatinine in CKD: Cr 1.70>>1.67. No baseline for comparison. Required HD x 3 at last hospitalization for acute renal failure. - s/p 1L NS bolus in the ED, continue mIVF - Avoid nephrotoxic agents - monitor BMP - Has outpatient f/u w/ Nephrology - on bicarb chronically at home, thought to be for  RTA will restart home dose this AM 1300 mg TID. 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 - Continue home meds: Lomotil 1 tablet qid prn, and Imodium 4mg  tid  Normocytic Anemia: Denies hematochezia or hematuria. Likely related to chronic kidney disease. - Continue home meds: ferrous sulfate 325mg  qd and Vitamin B12 148mcg daily - Repeat AM CBC  Chronic Back Pain: s/p lumbar spinal surgery in 2015 with lumbar rods placed. 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 - Continue home meds: Gabapentin 200mg  bid, Oxycodone 5mg  q4hrs prn  Anxiety/Insomia: - Continue home meds: Temazepam qhs and Remeron 15mg  qhs  - Holding home Xanax because she is already taking Temazepam  FEN/GI: Regular diet Prophylaxis: Heparin sq  Disposition: Home pending clincial improvement   Subjective:  Patient tremulous, states she has to use the restroom. Notes 2 episodes of diarrhea in past day (consistent with chronic diarrhea).  Will need to speak with patient further, urgently needs to use the restroom this AM when I visited her.  Objective: Temp:  [97.6 F (36.4 C)-98.4 F (36.9 C)] 98 F (36.7 C) (11/27 1308) Pulse Rate:  [71-86] 75 (11/27 1308) Resp:  [14-18] 14 (11/27 1308) BP: (93-119)/(36-61) 101/43 (11/27 1308) SpO2:  [97 %-100 %] 100 % (11/27 1308) Weight:  [51.3 kg (113 lb)] 51.3 kg (113 lb) (11/26 1927) Physical Exam: General: NAD, pale, frail-looking, trying to get out of bed to use commode Cardiovascular: RRR, no m/r/g,  Respiratory: CTA bil, no W/R/R Abdomen: soft and nontender, +nephrostomy site clean and dry  Extremities: No rashes or lesions Neuro: +tremulousness generalized on exam  Laboratory:  Recent Labs Lab 03/31/16 1340 04/01/16 0444  WBC 10.3 7.4  HGB 8.8* 8.2*  HCT 28.6* 26.3*  PLT 219 188    Recent Labs Lab 03/31/16 1340 04/01/16 0444  NA 137 142  K 4.3 4.4  CL 105 114*  CO2 25 23  BUN 18 19  CREATININE 1.70* 1.67*  CALCIUM 9.4 8.8*   GLUCOSE 78 86      Imaging/Diagnostic Tests:   Everrett Coombe, MD 04/01/2016, 2:14 PM PGY-1, [place] Intern pager: [phone], text pages welcome

## 2016-04-01 NOTE — Care Management Obs Status (Signed)
Hall NOTIFICATION   Patient Details  Name: Rhonda Price MRN: [id number] Date of Birth: 25-Oct-1950   Medicare Observation Status Notification Given:  Yes    Carles Collet, RN 04/01/2016, 11:02 AM

## 2016-04-01 NOTE — Progress Notes (Signed)
Patient's BP 93/42, MD notified. Bolus ordered. Will continue to monitor.

## 2016-04-01 NOTE — Procedures (Signed)
Interventional Radiology Procedure Note  Procedure: Routine exchange of left PCN.  New 1F tube.   Findings:  Distal ureteral obstruction .  Complications: None  Recommendations:  - To gravity drain - Routine exchange in 2-3 months. - Routine care  Signed,  Dulcy Fanny. Earleen Newport, DO

## 2016-04-01 NOTE — Care Management Note (Signed)
Case Management Note  Patient Details  Name: Alta Dewilde MRN: [id number] Date of Birth: Sep 13, 1950  Subjective/Objective:                 Patient from home with daughter. In obs for UTI, mentation clear, will have nephrostomy changed today per nursing report. Patient denies barriers to follow up care. PCP Dr. Thea Silversmith   Action/Plan:  DC to home, no CM needs identified.  Expected Discharge Date:                  Expected Discharge Plan:  Home/Self Care  In-House Referral:     Discharge planning Services  CM Consult  Post Acute Care Choice:    Choice offered to:     DME Arranged:    DME Agency:     HH Arranged:    HH Agency:     Status of Service:  In process, will continue to follow  If discussed at Long Length of Stay Meetings, dates discussed:    Additional Comments:  Carles Collet, RN 04/01/2016, 11:02 AM

## 2016-04-02 LAB — CBC
HEMATOCRIT: 26.3 % — AB (ref 36.0–46.0)
HEMOGLOBIN: 8.2 g/dL — AB (ref 12.0–15.0)
MCH: 30 pg (ref 26.0–34.0)
MCHC: 31.2 g/dL (ref 30.0–36.0)
MCV: 96.3 fL (ref 78.0–100.0)
Platelets: 208 10*3/uL (ref 150–400)
RBC: 2.73 MIL/uL — AB (ref 3.87–5.11)
RDW: 14.4 % (ref 11.5–15.5)
WBC: 7.7 10*3/uL (ref 4.0–10.5)

## 2016-04-02 MED ORDER — CEFDINIR 125 MG/5ML PO SUSR: 300.0000 mg | Freq: Two times a day (BID) | ORAL | Status: DC

## 2016-04-02 MED ORDER — CEFDINIR 125 MG/5ML PO SUSR
300.0000 mg | Freq: Two times a day (BID) | ORAL | Status: DC
  Administered 2016-04-02 – 2016-04-03 (×3): 300 mg via ORAL
  Filled 2016-04-02 (×5): qty 15

## 2016-04-02 MED ORDER — NORTRIPTYLINE HCL 25 MG PO CAPS
25.0000 mg | ORAL_CAPSULE | Freq: Every day | ORAL | Status: DC
  Administered 2016-04-02 – 2016-04-03 (×2): 25 mg via ORAL
  Filled 2016-04-02 (×2): qty 1

## 2016-04-02 MED ORDER — SODIUM CHLORIDE 0.45 % IV SOLN
INTRAVENOUS | Status: DC
  Administered 2016-04-02 – 2016-04-04 (×3): via INTRAVENOUS

## 2016-04-02 NOTE — Progress Notes (Signed)
Family Medicine Teaching Service Daily Progress Note Intern Pager: [phone]  Patient name: Rhonda Price Medical record number: [id number] Date of birth: 11-Feb-1951 Age: 65 y.o. Gender: female  Primary Care Provider: Thurman Coyer, MD Consultants: Urology Code Status: Full  Pt Overview and Major Events to Date:  11/26 admit for UTI-like symptoms 11/27 nephrostomy tube change by IR  Assessment and Plan: Rhonda Price is a 65 y.o. female presenting with UTI. PMH is significant for hx cervical cancer in the 70s, radiation colitis w/ chronic diarrhea, chronic back pain w/ lumbar rods, hx R nephrectomy, L nephrostomy tube.  Likely Urinary Tract Infection: Significant urologic history 2/2 to urinary tract damage s/p radiation for cervical cancer in 1970s. S/p R nephrectomy, S/p L nephrostomy tube placed this year. Hx of urosepsis warranting a prolonged hospitalization in [place] earlier this year. P/w increased urinary frequency, cloudy urine, sediment in neph tube, leukocytosis 10.8>>7.8 (pending today) LA 1.54. UA with many bacteria, large LE, positive nitrites, TNTC WBCs. Cr 1.7>>1.67 -  S/p nephrostomy tube change by IR (11/27) - S/p two days Ceftriaxone (11/26 -11/27 ), will transition to [facility] today and monitor overnight. - F/u BCx NGTD, UCx recollected. Patient admits to taking one day of ciprofloxacin prior to coming to hospital - Monitor I/Os - 1250 mL UOP yesterday - Tylenol PRN fever - appreciate urology recs  Hypotension: Pressures low since admit, patient s/p 2 1L boluses yesterday with pressures minimally improved to101/43 afterwards.  This AM BP low at 88/40. Patient indicates previous urosepsis episode she was afebrile and hypotensive - monitor BP closely - MIVFs at 61ml/hr - low threshold to escalate care if necessary  Elevated Creatinine in CKD: Cr 1.70>>1.67 >>(pending today). No baseline for comparison. Required HD x 3 at last hospitalization for acute renal  failure. - s/p 3L NS bolus since admit, continue mIVF - Avoid nephrotoxic agents - monitor BMP, will follow up today's Cr when it is resulted - Has outpatient f/u w/ Nephrology - continue home bicarb 1300 mg TID.  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 Notes ~3 episodes yesterday, baseline 10-15 episodes daily at home - Continue home meds: Lomotil 1 tablet qid prn, and Imodium 4mg  tid - add nortryptyline 25 mg qhs for further diarrhea control  Normocytic Anemia: Denies hematochezia or hematuria. Likely related to chronic kidney disease. - Continue home meds: ferrous sulfate 325mg  qd and Vitamin B12 15mcg daily -  Will follow up AM CBC once resulted  Chronic Back Pain: s/p lumbar spinal surgery in 2015 with lumbar rods placed. 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 - Continue home meds: Gabapentin 200mg  bid, Oxycodone 5mg  q4hrs prn  Anxiety/Insomia: - Continue home meds: Temazepam qhs and Remeron 15mg  qhs  - Holding home Xanax because she is already taking Temazepam  FEN/GI: Regular diet, 1/2 NS at 1/2 maintenance Prophylaxis: Heparin sq  Disposition: Home pending clincial improvement   Subjective:  Patient was hypotensive early this AM with BP 88/44, MAP 59.  Patient was sleeping, was not bolused again. Blood pressure improved this AM to 91/46.  Patient eating breakfast when I saw her this AM. Stoic affect, but no complaints.  No N/V/D/C.    Objective: Temp:  [98.1 F (36.7 C)-98.6 F (37 C)] 98.4 F (36.9 C) (11/28 0429) Pulse Rate:  [65-72] 65 (11/28 0429) Resp:  [18-20] 20 (11/28 0429) BP: (88-111)/(40-46) 91/46 (11/28 0730) SpO2:  [92 %-99 %] 92 % (11/28 0429) Physical Exam: General: NAD, pale, frail-looking, sits  in bed eating breakfast Cardiovascular: RRR, no m/r/g,  Respiratory: CTA bil, no W/R/R Abdomen: soft and nontender, +nephrostomy site bandaged clean and dry Extremities: No rashes or lesions Neuro: CN II-XII  Grossly intact Psych: affect flat, thought process linear, AAOx3  Laboratory:  Recent Labs Lab 03/31/16 1340 04/01/16 0444  WBC 10.3 7.4  HGB 8.8* 8.2*  HCT 28.6* 26.3*  PLT 219 188    Recent Labs Lab 03/31/16 1340 04/01/16 0444  NA 137 142  K 4.3 4.4  CL 105 114*  CO2 25 23  BUN 18 19  CREATININE 1.70* 1.67*  CALCIUM 9.4 8.8*  GLUCOSE 78 86     Imaging/Diagnostic Tests:   Rhonda Coombe, MD 04/02/2016, 1:28 PM PGY-1, [place] Intern pager: [phone], text pages welcome

## 2016-04-03 DIAGNOSIS — N390 Urinary tract infection, site not specified: Principal | ICD-10-CM

## 2016-04-03 DIAGNOSIS — B952 Enterococcus as the cause of diseases classified elsewhere: Secondary | ICD-10-CM

## 2016-04-03 LAB — CBC
HEMATOCRIT: 25.1 % — AB (ref 36.0–46.0)
Hemoglobin: 7.9 g/dL — ABNORMAL LOW (ref 12.0–15.0)
MCH: 29.9 pg (ref 26.0–34.0)
MCHC: 31.5 g/dL (ref 30.0–36.0)
MCV: 95.1 fL (ref 78.0–100.0)
Platelets: 183 10*3/uL (ref 150–400)
RBC: 2.64 MIL/uL — ABNORMAL LOW (ref 3.87–5.11)
RDW: 14.2 % (ref 11.5–15.5)
WBC: 7.9 10*3/uL (ref 4.0–10.5)

## 2016-04-03 LAB — BASIC METABOLIC PANEL
Anion gap: 6 (ref 5–15)
BUN: 18 mg/dL (ref 6–20)
CHLORIDE: 116 mmol/L — AB (ref 101–111)
CO2: 19 mmol/L — AB (ref 22–32)
Calcium: 8.9 mg/dL (ref 8.9–10.3)
Creatinine, Ser: 1.61 mg/dL — ABNORMAL HIGH (ref 0.44–1.00)
GFR calc Af Amer: 37 mL/min — ABNORMAL LOW (ref >60)
GFR calc non Af Amer: 32 mL/min — ABNORMAL LOW (ref >60)
Glucose, Bld: 79 mg/dL (ref 65–99)
POTASSIUM: 4 mmol/L (ref 3.5–5.1)
SODIUM: 141 mmol/L (ref 135–145)

## 2016-04-03 MED ORDER — SODIUM CHLORIDE 0.9 % IV SOLN
1.0000 g | Freq: Three times a day (TID) | INTRAVENOUS | Status: DC
  Administered 2016-04-03 – 2016-04-04 (×2): 1 g via INTRAVENOUS
  Filled 2016-04-03 (×4): qty 1000

## 2016-04-03 NOTE — Discharge Instructions (Signed)
You were hospitalized with a UTI which was treated with antibiotics. You also had your nephrostomy tube changed during this hospitalization. Your condition improved on antibiotics and you were considered stable to be discharged. - Please complete your course of antibiotics - Please follow up with your primary care provider within one week of hospital discharge.

## 2016-04-03 NOTE — Progress Notes (Signed)
. Family Medicine Teaching Service Daily Progress Note Intern Pager: [phone]  Patient name: Rhonda Price Medical record number: [id number] Date of birth: 04-10-1951 Age: 65 y.o. Gender: female  Primary Care Provider: Thurman Coyer, MD Consultants: Urology Code Status: Full  Pt Overview and Major Events to Date:  11/26 admit for UTI-like symptoms 11/27 nephrostomy tube change by IR 11/28 transition to oral abx (omnicef) 11/29 UCx >100k Enterococcus faecium, pending sensitivities  Assessment and Plan: Ahliya Muska is a 65 y.o. female presenting with UTI. PMH is significant for hx cervical cancer in the 70s, radiation colitis w/ chronic diarrhea, chronic back pain w/ lumbar rods, hx R nephrectomy, L nephrostomy tube.  Likely Urinary Tract Infection: Significant urologic history 2/2 to urinary tract damage s/p radiation for cervical cancer in 1970s. S/p R nephrectomy, S/p L nephrostomy tube placed this year. Hx of urosepsis warranting a prolonged hospitalization in [place] earlier this year. P/w increased urinary frequency, cloudy urine, sediment in neph tube, leukocytosis 10.8>>7.9.  UA with many bacteria, large LE, positive nitrites, TNTC WBCs.  - S/p nephrostomy tube change by IR (11/27) - S/p two days Ceftriaxone, transitioned to oral antibiotics Omnicef 11/27 and monitored overnight. - Continue day #4 total of antibiotics Omnicef (11/26 - ) - UCx >100k Enterococcus faecium, pending sensitivities - consider transition to ampicillin depending on sensitivities - Monitor I/Os - 1000 mL UOP yesterday - Tylenol PRN fever - appreciate urology recs  Hypotension: Pressures low since admit but improved overnight at 112/52.  - monitor BP closely - MIVFs at 52ml/hr - low threshold to escalate care if necessary  Normocytic Anemia: Denies hematochezia or hematuria. Likely related to chronic kidney disease. - Continue home meds: ferrous sulfate 325mg  qd and Vitamin B12 183mcg daily - Hgb  8.8>>>7.9  Elevated Creatinine in CKD: Cr 1.70>>1.61. No baseline for comparison. Required HD x 3 at last hospitalization for acute renal failure. 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 Notes ~3 episodes yesterday, baseline 10-15 episodes daily at home - Continue home meds: Lomotil 1 tablet qid prn, and Imodium 4mg  tid - added nortryptyline 25 mg qhs for further diarrhea control  Chronic Back Pain: s/p lumbar spinal surgery in 2015 with lumbar rods placed. 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 - Continue home meds: Gabapentin 200mg  bid, Oxycodone 5mg  q4hrs prn  Anxiety/Insomia: - Continue home meds: Temazepam qhs and Remeron 15mg  qhs  - Holding home Xanax because she is already taking Temazepam  FEN/GI: Regular diet, 1/2 NS at 1/2 maintenance Prophylaxis: Heparin sq  Disposition: Home pending clincial improvement   Subjective:  Patient doing well this morning, no acute events overnight. BPs improved yesterday and overnight. Does note a strange "discomfort - pressure-like" when she urinates, denies burning or pain with urination. She is worried she has another UTI, although we are still treating a current UTI.    Objective: Temp:  [98 F (36.7 C)-99.1 F (37.3 C)] 98.1 F (36.7 C) (11/29 0740) Pulse Rate:  [66-73] 66 (11/29 0740) Resp:  [16-19] 16 (11/29 0740) BP: (102-112)/(49-56) 102/56 (11/29 0740) SpO2:  [95 %-100 %] 95 % (11/29 0740) Physical Exam: General: NAD, pale, frail-looking, sits in bed eating breakfast Cardiovascular: RRR, no m/r/g,  Respiratory: CTA bil, no W/R/R Abdomen: soft,  no tenderness to palpation, +nephrostomy site bandaged clean and dry Extremities: No rashes or lesions Neuro: CN II-XII Grossly  intact Psych: affect diminished, thought process linear, AAOx3  Laboratory:  Recent Labs Lab 04/01/16 0444 04/02/16 0730 04/03/16 0412  WBC 7.4 7.7 7.9  HGB 8.2* 8.2* 7.9*  HCT 26.3* 26.3* 25.1*  PLT 188 208 183    Recent Labs Lab 03/31/16 1340 04/01/16 0444 04/03/16 0412  NA 137 142 141  K 4.3 4.4 4.0  CL 105 114* 116*  CO2 25 23 19*  BUN 18 19 18   CREATININE 1.70* 1.67* 1.61*  CALCIUM 9.4 8.8* 8.9  GLUCOSE 78 86 79     Imaging/Diagnostic Tests:   Everrett Coombe, MD 04/03/2016, 9:38 AM PGY-1, [place] Intern pager: [phone], text pages welcome

## 2016-04-03 NOTE — Care Management Note (Signed)
Case Management Note  Patient Details  Name: Rhonda Price MRN: [id number] Date of Birth: November 28, 1950  Subjective/Objective:               Patient from home with daughter. In obs for UTI, mentation clear, will have nephrostomy changed today per nursing report. Patient denies barriers to follow up care. PCP Dr. Thea Silversmith     Action/Plan:  Patient active with [facility] for [facility] PT.   Expected Discharge Date:                  Expected Discharge Plan:  [place]  In-House Referral:     Discharge planning Services  CM Consult  Post Acute Care Choice:  Home Health, Resumption of Svcs/PTA Provider Choice offered to:     DME Arranged:    DME Agency:     HH Arranged:  PT [place]:  [place] ([facility])  Status of Service:  Completed, signed off  If discussed at [place] of Stay Meetings, dates discussed:    Additional Comments:  Carles Collet, RN 04/03/2016, 1:36 PM

## 2016-04-04 DIAGNOSIS — A491 Streptococcal infection, unspecified site: Secondary | ICD-10-CM

## 2016-04-04 DIAGNOSIS — Z1621 Resistance to vancomycin: Secondary | ICD-10-CM

## 2016-04-04 LAB — CBC
HCT: 25.3 % — ABNORMAL LOW (ref 36.0–46.0)
HEMOGLOBIN: 8.1 g/dL — AB (ref 12.0–15.0)
MCH: 30.1 pg (ref 26.0–34.0)
MCHC: 32 g/dL (ref 30.0–36.0)
MCV: 94.1 fL (ref 78.0–100.0)
PLATELETS: 216 10*3/uL (ref 150–400)
RBC: 2.69 MIL/uL — AB (ref 3.87–5.11)
RDW: 14.3 % (ref 11.5–15.5)
WBC: 7.9 10*3/uL (ref 4.0–10.5)

## 2016-04-04 LAB — BASIC METABOLIC PANEL
Anion gap: 6 (ref 5–15)
BUN: 15 mg/dL (ref 6–20)
CHLORIDE: 114 mmol/L — AB (ref 101–111)
CO2: 20 mmol/L — ABNORMAL LOW (ref 22–32)
CREATININE: 1.57 mg/dL — AB (ref 0.44–1.00)
Calcium: 9 mg/dL (ref 8.9–10.3)
GFR, EST AFRICAN AMERICAN: 38 mL/min — AB (ref >60)
GFR, EST NON AFRICAN AMERICAN: 33 mL/min — AB (ref >60)
Glucose, Bld: 81 mg/dL (ref 65–99)
POTASSIUM: 3.9 mmol/L (ref 3.5–5.1)
SODIUM: 140 mmol/L (ref 135–145)

## 2016-04-04 LAB — URINE CULTURE

## 2016-04-04 MED ORDER — LINEZOLID 600 MG PO TABS
600.0000 mg | ORAL_TABLET | Freq: Two times a day (BID) | ORAL | Status: DC
  Administered 2016-04-04 – 2016-04-05 (×3): 600 mg via ORAL
  Filled 2016-04-04 (×4): qty 1

## 2016-04-04 MED ORDER — MIRTAZAPINE 15 MG PO TABS: 7.5000 mg | ORAL_TABLET | Freq: Every day | ORAL | Status: DC

## 2016-04-04 MED ORDER — MIRTAZAPINE 15 MG PO TABS: 15.0000 mg | ORAL_TABLET | Freq: Every day | ORAL | Status: DC

## 2016-04-04 NOTE — Discharge Summary (Signed)
[facility] Discharge Summary  Patient name: Rhonda Price Medical record number: [id number] Date of birth: May 23, 1950 Age: 65 y.o. Gender: female Date of Admission: 03/31/2016  Date of Discharge: 04/05/16  Admitting Physician: Kinnie Feil, MD  Primary Care Provider: Thurman Coyer, MD Consultants: None  Indication for Hospitalization: UTI  Discharge Diagnoses/Problem List:  Patient Active Problem List   Diagnosis Date Noted  . Vancomycin resistant Enterococcus   . Urinary tract infection due to Enterococcus   . History of nephrectomy   . Hx of nephrostomy   . UTI (urinary tract infection) 03/31/2016  . Pressure injury of skin 03/31/2016  . Hypotension   . Anemia     Disposition: Home with home health services (from previous)  Discharge Condition: Stable  Discharge Exam:  Temp:  [98.1 F (36.7 C)-99.3 F (37.4 C)] 98.1 F (36.7 C) (11/30 0448) Pulse Rate:  [64-76] 64 (11/30 0448) Resp:  [18-22] 22 (11/30 0448) BP: (107-116)/(47-50) 116/49 (11/30 0448) SpO2:  [95 %-98 %] 95 % (11/30 0448) Physical Exam: General: NAD, pale, frail-looking, sits in bed eating breakfast Cardiovascular: RRR, no m/r/g,  Respiratory: CTA bil, no W/R/R Abdomen: soft, no non-tender, non-distended +nephrostomy site bandaged clean and dry Extremities: No rashes or lesions Neuro: CN II-XII Grossly intact Psych: affect slightly diminished, speaks very softly thought process linear, AAOx3  Brief Hospital Course:  Rhonda Price a 65 y.o.femalepresenting with UTI. She has a significant urologic history due to radiation she received for cervical cancer in the 1970s, which resulted in radiation colitis with chronic diarrhea, chronic back pain with lumbar rods, and recurrent UTI's that resulted in right nephrectomy and left nephrostomy tube.  On admission the patient was noted to have low blood pressures and there was concern for sepsis, however her blood  pressures did respond to fluids and no pressors were required. Urine cultures were taken, the patient was empirically started on ceftriaxone, which was transitioned to Linezolid when urine culture sensitivities resulted with vancomycin-resistant enterococcus.  The patient was monitored overnight on Linezolid and considered stable for discharge the following morning. Some diarrhea was noted, although patient is known to have up to 15 episodes of diarrhea per day at baseline thought to be due to radiation colitis.   Issues for Follow Up:  1. Patient discharged on a 10 day course of Linezolid (11/30 through 12/9).  Her home Remeron was held for the course of this medication due to increased risk of serotonin syndrome. Restart as appropriate after finishing antibiotic course.  2. Please recheck CBC one week after discharge (~12/7) as Linezolid can cause cyptopenia. CBC was stable at discharge. 3. Left nephrostomy tube was changed on 11/27 by urology. They recommend changes q3 months or less.  4. Increasing diarrhea on the day of starting linezolid. Patient reports history of diarrhea with other antibiotics, as well as extensive history of diarrhea thought to be secondary to radiation colitis. Staff report this is decreasing at time of discharge, will not test for C diff. Please ensure this is normalizing. Nortriptyline was discontinued due to increased risk of serotonin syndrome on Linezolid. This was being used for diarrhea, so patient may need additional treatment for diarrhea. 5. Recheck BMP with the CBC in 1 week to check on electrolytes due to diarrhea.   Significant Procedures: Left nephrostomy tube replacement 11/27  Significant Labs and Imaging:   Recent Labs Lab 04/03/16 0412 04/04/16 0430 04/05/16 0400  WBC 7.9 7.9 8.3  HGB 7.9* 8.1* 7.8*  HCT  25.1* 25.3* 24.3*  PLT 183 216 184    Recent Labs Lab 03/31/16 1340 04/01/16 0444 04/03/16 0412 04/04/16 0430 04/05/16 0400  NA 137 142  141 140 140  K 4.3 4.4 4.0 3.9 3.5  CL 105 114* 116* 114* 112*  CO2 25 23 19* 20* 19*  GLUCOSE 78 86 79 81 85  BUN 18 19 18 15 16   CREATININE 1.70* 1.67* 1.61* 1.57* 1.54*  CALCIUM 9.4 8.8* 8.9 9.0 8.9    Results/Tests Pending at Time of Discharge: None  Discharge Medications:    Medication List    STOP taking these medications   mirtazapine 15 MG tablet Commonly known as:  REMERON     TAKE these medications   ALPRAZolam 0.5 MG tablet Commonly known as:  XANAX Take 0.5 mg by mouth 3 (three) times daily as needed for anxiety.   cholecalciferol 400 units Tabs tablet Commonly known as:  VITAMIN D Take 400 Units by mouth daily.   diphenoxylate-atropine 2.5-0.025 MG tablet Commonly known as:  LOMOTIL Take 1 tablet by mouth 4 (four) times daily as needed for diarrhea or loose stools.   ferrous sulfate 325 (65 FE) MG tablet Take 325 mg by mouth daily with breakfast.   gabapentin 100 MG capsule Commonly known as:  NEURONTIN Take 200 mg by mouth 2 (two) times daily.   linezolid 600 MG tablet Commonly known as:  ZYVOX Take 1 tablet (600 mg total) by mouth every 12 (twelve) hours.   loperamide 2 MG capsule Commonly known as:  IMODIUM Take 4 mg by mouth 3 (three) times daily.   magnesium oxide 400 MG tablet Commonly known as:  MAG-OX Take 400 mg by mouth 2 (two) times daily.   multivitamin tablet Take 1 tablet by mouth daily.   oxyCODONE 5 MG immediate release tablet Commonly known as:  Oxy IR/ROXICODONE Take 5 mg by mouth every 4 (four) hours as needed for severe pain.   sodium bicarbonate 650 MG tablet Take 1,300 mg by mouth 3 (three) times daily.   temazepam 30 MG capsule Commonly known as:  RESTORIL Take 30 mg by mouth at bedtime.   vitamin B-12 100 MCG tablet Commonly known as:  CYANOCOBALAMIN Take 100 mcg by mouth daily.       Discharge Instructions: Please refer to Patient Instructions section of EMR for full details.  Patient was counseled  important signs and symptoms that should prompt return to medical care, changes in medications, dietary instructions, activity restrictions, and follow up appointments.   Follow-Up Appointments: Follow-up Information    CLOWARD,DAVIS L, MD Follow up.   Specialty:  Internal Medicine Why:  Please call to schedule a follow up with your PCP within one week of discharge. Contact information: [address] [place] [address] [phone]        [facility] Follow up.   Specialty:  [place] Why:  resume home health services with [facility] Contact information: [place] [phone]           Sela Hilding, MD 04/05/2016, 2:28 PM PGY-1, [place]

## 2016-04-04 NOTE — Progress Notes (Signed)
. Family Medicine Teaching Service Daily Progress Note Intern Pager: [phone]  Patient name: Rhonda Price Medical record number: [id number] Date of birth: October 08, 1950 Age: 65 y.o. Gender: female  Primary Care Provider: Thurman Coyer, MD Consultants: Urology Code Status: Full  Pt Overview and Major Events to Date:  11/26 admit for UTI-like symptoms 11/27 nephrostomy tube change by IR 11/28 transition to oral abx (omnicef) 11/29 UCx >100k Enterococcus faecium, pending sns, transitioned to ampicillin  Assessment and Plan: Rhonda Price is a 65 y.o. female presenting with UTI. PMH is significant for hx cervical cancer in the 70s, radiation colitis w/ chronic diarrhea, chronic back pain w/ lumbar rods, hx R nephrectomy, L nephrostomy tube.  Likely Urinary Tract Infection: Significant urologic history 2/2 to urinary tract damage s/p radiation for cervical cancer in 1970s. S/p R nephrectomy, S/p L nephrostomy tube placed this year. Hx of urosepsis warranting a prolonged hospitalization in [place] earlier this year. P/w increased urinary frequency and signs of UTI on UA, now s/p 4 days of treatment with cephalosporin, urine cultures now growing VRE. Transition to linezolid for 7-10d course today. - Pt c/o feeling like she is retaining urine today. Will bladder scan and stop nortryptyline (given for anticholinergic effects for diarrhea this admission) - S/p nephrostomy tube change by IR (11/27) - Day #1 Linezolid started (11/30) for a 7 day course - Pt will need CBC f/u in 1 week because linezolid can cause cytopenia - Monitor I/Os - 250 mL UOP yesterday - Tylenol PRN fever  Hypotension: Pressures low on admission but improved over last two days. 116/49 this morning.  - monitor BP closely - MIVFs at 1ml/hr - low threshold to escalate care if necessary  Normocytic Anemia: Denies hematochezia or hematuria. Likely related to chronic kidney disease. - Continue home meds: ferrous sulfate  325mg  qd and Vitamin B12 180mcg daily - Hgb 8.8>>>8.1  Elevated Creatinine in CKD: Cr 1.70>>1.57. No baseline for comparison. Required HD x 3 at last hospitalization for acute renal failure. 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 Notes ~3 episodes yesterday, baseline 10-15 episodes daily at home - Continue home meds: Lomotil 1 tablet qid prn, and Imodium 4mg  tid - discontinue nortryptyline 25 mg qhs previously added for further diarrhea control as pt now with c/o urinary retention  Chronic Back Pain: s/p lumbar spinal surgery in 2015 with lumbar rods placed. 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 - Continue home meds: Gabapentin 200mg  bid, Oxycodone 5mg  q4hrs prn  Anxiety/Insomia: - Continue home meds: Temazepam qhs and Remeron 15mg  qhs  - Holding home Xanax because she is already taking Temazepam  FEN/GI: Regular diet, 1/2 NS at 1/2 maintenance Prophylaxis: Heparin sq  Disposition: Home pending clincial improvement   Subjective:  Patient complains of feeling distension overnight in bladder. Otherwise no complaints this AM, no dysuria, no N/V/C, no acute events overnight.  Objective: Temp:  [98.1 F (36.7 C)-99.3 F (37.4 C)] 98.1 F (36.7 C) (11/30 0448) Pulse Rate:  [64-76] 64 (11/30 0448) Resp:  [18-22] 22 (11/30 0448) BP: (107-116)/(47-50) 116/49 (11/30 0448) SpO2:  [95 %-98 %] 95 % (11/30 0448) Physical Exam: General: NAD, pale, frail-looking, sits in bed eating breakfast Cardiovascular: RRR, no m/r/g,  Respiratory: CTA bil, no W/R/R Abdomen:  soft, no tenderness to palpation but states "feels weird" when I palpate, +nephrostomy site bandaged clean and dry Extremities: No rashes or lesions Neuro: CN II-XII Grossly  intact Psych: affect slightly diminished, speaks very softly thought process linear, AAOx3  Laboratory:  Recent Labs Lab 04/02/16 0730 04/03/16 0412 04/04/16 0430  WBC 7.7 7.9 7.9  HGB 8.2* 7.9* 8.1*  HCT 26.3* 25.1* 25.3*  PLT 208 183 216    Recent Labs Lab 04/01/16 0444 04/03/16 0412 04/04/16 0430  NA 142 141 140  K 4.4 4.0 3.9  CL 114* 116* 114*  CO2 23 19* 20*  BUN 19 18 15   CREATININE 1.67* 1.61* 1.57*  CALCIUM 8.8* 8.9 9.0  GLUCOSE 86 79 81     Imaging/Diagnostic Tests:   Everrett Coombe, MD 04/04/2016, 1:48 PM PGY-1, [place] Intern pager: [phone], text pages welcome

## 2016-04-04 NOTE — Evaluation (Signed)
Physical Therapy Evaluation Patient Details Name: Rhonda Price MRN: [id number] DOB: [birth year] Today's Date: 04/04/2016   History of Present Illness  65 yo admitted with UTI. PMHx: cervical CA, chemo induced colitis, Rt foot drop, chronic back pain, neuropathy, nephrostomy  Clinical Impression   Patient presents with hip flexion weakness, decreased activity tolerance, and unsteadiness and would benefit from physical therapy addressing lower extremity strengthening, gait training, and balance training in order to decrease her risk of falls and maximize function within the home.  Patient has a fair prognosis to return home due to positive prognostic factors like familial support from her daughter and motivation to be mobile and negative prognostic factors like past medical history and previous falls. During ambulation, patient did not seem to notice her decreased awareness of safety and required significant gait cuing to ensure safety. Patient was educated on plan of care and she noted that her daughter and herself would like to keep the current home health that she has. Will continue to follow.     Follow Up Recommendations Home health PT;Supervision - Intermittent    Equipment Recommendations  None recommended by PT    Recommendations for Other Services OT consult     Precautions / Restrictions Precautions Precautions: Fall Required Braces or Orthoses: Other Brace/Splint Other Brace/Splint: pt wears Rt AFO but not present currently Restrictions Weight Bearing Restrictions: No      Mobility  Bed Mobility Overal bed mobility: Needs Assistance Bed Mobility: Supine to Sit     Supine to sit: Min assist     General bed mobility comments: assist to raise trunk from surface with cues, supervision to scoot fully to EOB  Transfers Overall transfer level: Needs assistance Equipment used: None Transfers: Sit to/from Stand Sit to Stand: Min guard         General transfer  comment: cues for hand placement and safety  Ambulation/Gait Ambulation/Gait assistance: Min assist Ambulation Distance (Feet): 200 Feet Assistive device: Rolling walker (2 wheeled) Gait Pattern/deviations: Shuffle;Trunk flexed   Gait velocity interpretation: Below normal speed for age/gender (too quick for pt safety) General Gait Details: cues for posture, position in RW, decreased speed but walks too quickly for pt safety and ability to control RW. Pt with shuffle gait with decreased dorsiflexion bil. Pt maintains hip flexion pt able to extend with cues, does not maintain extension. Tendency to veer left  Stairs            Wheelchair Mobility    Modified Rankin (Stroke Patients Only)       Balance Overall balance assessment: Needs assistance;History of Falls   Sitting balance-Leahy Scale: Fair       Standing balance-Leahy Scale: Poor                               Pertinent Vitals/Pain Pain Assessment: No/denies pain    Home Living Family/patient expects to be discharged to:: Private residence Living Arrangements: Children Available Help at Discharge: Family;Available PRN/intermittently Type of Home: House Home Access: Stairs to enter Entrance Stairs-Rails: None Entrance Stairs-Number of Steps: 1 Home Layout: Two level;Able to live on main level with bedroom/bathroom Home Equipment: Bedside commode;Shower seat;Walker - 2 wheels      Prior Function Level of Independence: Needs assistance   Gait / Transfers Assistance Needed: mod I with RW in home  ADL's / Homemaking Assistance Needed: supervision to mod assist for bathing on seat in shower, assist for dressing. Daughter is  doing all homemaking  Comments: pt just moved from [place] to live with daughter 03/04/16     Hand Dominance        Extremity/Trunk Assessment   Upper Extremity Assessment: Generalized weakness           Lower Extremity Assessment: Generalized weakness;RLE  deficits/detail RLE Deficits / Details: dorsiflexion 0/5, hip flexion 4/5, knee extension 5/5    Cervical / Trunk Assessment: Normal  Communication   Communication: Expressive difficulties (pt states she loses her voice when she is sick)  Cognition Arousal/Alertness: Awake/alert Behavior During Therapy: Flat affect Overall Cognitive Status: No family/caregiver present to determine baseline cognitive functioning Area of Impairment: Safety/judgement         Safety/Judgement: Decreased awareness of safety          General Comments General comments (skin integrity, edema, etc.): Pt with 3 falls in the last year    Exercises     Assessment/Plan    PT Assessment Patient needs continued PT services  PT Problem List Decreased mobility;Decreased safety awareness;Decreased activity tolerance;Decreased balance;Decreased knowledge of use of DME;Impaired sensation          PT Treatment Interventions Gait training;Functional mobility training;DME instruction;Therapeutic activities;Therapeutic exercise;Patient/family education    PT Goals (Current goals can be found in the Care Plan section)  Acute Rehab PT Goals Patient Stated Goal: go outside and care for myself PT Goal Formulation: With patient Time For Goal Achievement: 04/18/16 Potential to Achieve Goals: Fair    Frequency Min 3X/week   Barriers to discharge Decreased caregiver support      Co-evaluation               End of Session Equipment Utilized During Treatment: Gait belt Activity Tolerance: Patient tolerated treatment well Patient left: in chair;with call bell/phone within reach (no chair alarms present on unit and RN agreed to OOB with door open) Nurse Communication: Mobility status         Time: [id number] PT Time Calculation (min) (ACUTE ONLY): 30 min   Charges:   PT Evaluation $PT Eval Moderate Complexity: 1 Procedure PT Treatments $Gait Training: 8-22 mins   PT G Codes:        Regino Fournet 04-27-16, 1:37 PM  [place] SPT [id number]

## 2016-04-04 NOTE — Progress Notes (Signed)
Transitions of Care Pharmacy Note  Plan:  Educated on linezolid, its administration and why she must not take mirtazapine while she is on linezolid. Also discussed that mirtazapine can be resumed once she has completed her course of antibiotics to the end.   Addressed concerns regarding whether she could eat dairy with linezolid and to take it without regard to meals.   --------------------------------------------- Rhonda Price is an 65 y.o. female who presents with a chief complaint of a UTI. In anticipation of discharge, pharmacy has reviewed this patient's prior to admission medication history, as well as current inpatient medications listed per the [facility].  Current medication indications, dosing, frequency, and notable side effects reviewed with patient. patient verbalized understanding of current inpatient medication regimen and is aware that the After Visit Summary when presented, will represent the most accurate medication list at discharge.   [name] expressed concerns regarding whether or not she could eat dairy with the antibiotic. I counseled her that linezolid can be taken without regard to meals and does not interact with dairy products.    Assessment: Understanding of regimen: good Understanding of indications: fair Potential of compliance: good Barriers to Obtaining Medications: No  Patient instructed to contact inpatient pharmacy team with further questions or concerns if needed.    Time spent preparing for discharge counseling: 10 minutes Time spent counseling patient: 10 minutes    Thank you for allowing pharmacy to be a part of this patient's care.  Ihor Austin, PharmD PGY1 Pharmacy Resident Pager: [phone]

## 2016-04-05 LAB — CBC
HEMATOCRIT: 24.3 % — AB (ref 36.0–46.0)
HEMOGLOBIN: 7.8 g/dL — AB (ref 12.0–15.0)
MCH: 30.2 pg (ref 26.0–34.0)
MCHC: 32.1 g/dL (ref 30.0–36.0)
MCV: 94.2 fL (ref 78.0–100.0)
Platelets: 184 10*3/uL (ref 150–400)
RBC: 2.58 MIL/uL — AB (ref 3.87–5.11)
RDW: 14.5 % (ref 11.5–15.5)
WBC: 8.3 10*3/uL (ref 4.0–10.5)

## 2016-04-05 LAB — CULTURE, BLOOD (ROUTINE X 2)
CULTURE: NO GROWTH
Culture: NO GROWTH

## 2016-04-05 LAB — BASIC METABOLIC PANEL
Anion gap: 9 (ref 5–15)
BUN: 16 mg/dL (ref 6–20)
CHLORIDE: 112 mmol/L — AB (ref 101–111)
CO2: 19 mmol/L — AB (ref 22–32)
CREATININE: 1.54 mg/dL — AB (ref 0.44–1.00)
Calcium: 8.9 mg/dL (ref 8.9–10.3)
GFR calc non Af Amer: 34 mL/min — ABNORMAL LOW (ref >60)
GFR, EST AFRICAN AMERICAN: 39 mL/min — AB (ref >60)
Glucose, Bld: 85 mg/dL (ref 65–99)
POTASSIUM: 3.5 mmol/L (ref 3.5–5.1)
Sodium: 140 mmol/L (ref 135–145)

## 2016-04-05 MED ORDER — HEPARIN SOD (PORK) LOCK FLUSH 100 UNIT/ML IV SOLN
500.0000 [IU] | INTRAVENOUS | Status: AC | PRN
  Administered 2016-04-05: 500 [IU]

## 2016-04-05 MED ORDER — LINEZOLID 600 MG PO TABS: 600.0000 mg | ORAL_TABLET | Freq: Two times a day (BID) | ORAL | 0 refills | Status: AC

## 2016-04-05 NOTE — Progress Notes (Signed)
Nsg Discharge Note  Admit Date:  03/31/2016 Discharge date: 04/05/2016   Rhonda Price to be D/C'd Home per MD order.  AVS completed.  Copy for chart, and copy for patient signed, and dated. Patient/caregiver able to verbalize understanding.  Discharge Medication:   Medication List    STOP taking these medications   mirtazapine 15 MG tablet Commonly known as:  REMERON     TAKE these medications   ALPRAZolam 0.5 MG tablet Commonly known as:  XANAX Take 0.5 mg by mouth 3 (three) times daily as needed for anxiety.   cholecalciferol 400 units Tabs tablet Commonly known as:  VITAMIN D Take 400 Units by mouth daily.   diphenoxylate-atropine 2.5-0.025 MG tablet Commonly known as:  LOMOTIL Take 1 tablet by mouth 4 (four) times daily as needed for diarrhea or loose stools.   ferrous sulfate 325 (65 FE) MG tablet Take 325 mg by mouth daily with breakfast.   gabapentin 100 MG capsule Commonly known as:  NEURONTIN Take 200 mg by mouth 2 (two) times daily.   linezolid 600 MG tablet Commonly known as:  ZYVOX Take 1 tablet (600 mg total) by mouth every 12 (twelve) hours.   loperamide 2 MG capsule Commonly known as:  IMODIUM Take 4 mg by mouth 3 (three) times daily.   magnesium oxide 400 MG tablet Commonly known as:  MAG-OX Take 400 mg by mouth 2 (two) times daily.   multivitamin tablet Take 1 tablet by mouth daily.   oxyCODONE 5 MG immediate release tablet Commonly known as:  Oxy IR/ROXICODONE Take 5 mg by mouth every 4 (four) hours as needed for severe pain.   sodium bicarbonate 650 MG tablet Take 1,300 mg by mouth 3 (three) times daily.   temazepam 30 MG capsule Commonly known as:  RESTORIL Take 30 mg by mouth at bedtime.   vitamin B-12 100 MCG tablet Commonly known as:  CYANOCOBALAMIN Take 100 mcg by mouth daily.       Discharge Assessment: Vitals:   04/05/16 0627 04/05/16 0844  BP: (!) 101/43 (!) 105/58  Pulse: (!) 56 73  Resp: 20 18  Temp: 98 F  (36.7 C) 97.6 F (36.4 C)   Skin clean, dry and intact without evidence of skin break down, no evidence of skin tears noted. IV catheter discontinued intact. Site without signs and symptoms of complications - no redness or edema noted at insertion site, patient denies c/o pain - only slight tenderness at site.  Dressing with slight pressure applied.  D/c Instructions-Education: Discharge instructions given to patient/family with verbalized understanding. D/c education completed with patient/family including follow up instructions, medication list, d/c activities limitations if indicated, with other d/c instructions as indicated by MD - patient able to verbalize understanding, all questions fully answered. Patient instructed to return to ED, call 911, or call MD for any changes in condition.  Patient escorted via [place], and D/C home via private auto.  Salley Slaughter, RN 04/05/2016 5:10 PM

## 2016-04-05 NOTE — Progress Notes (Signed)
Benefit check submitted for Zyvox, awaiting results

## 2016-04-05 NOTE — Progress Notes (Addendum)
CM spoke with [facility] in [place]. Zyvox (generic) in stock. Brand name Zyvox benefit check ~$500. Spoke with [facility] resident who was unable to confirm how many tabs she would need in order to call in medication to verify price for generic. Resident stated he would call back. 14:00 CM contacted MD for clarification of Zyvox. Patient will need 600 mg BID through 12/13, 25 tabs. Rx called in to pharmacy for price check on generic.  Cost is $72.22

## 2016-04-05 NOTE — Progress Notes (Signed)
Pt feels better. Growing VRE, will go home on linezolid.  No real GU intervention needed at present, but will help long term mgmt of nephrostomy tube.  We will call her to schedule f/u. I would recommend more frequent nephrostomy tube changes than q 3 mos.

## 2016-09-13 ENCOUNTER — Emergency Department (HOSPITAL_COMMUNITY): Payer: Medicare Other

## 2016-09-13 ENCOUNTER — Emergency Department (HOSPITAL_COMMUNITY)
Admission: EM | Admit: 2016-09-13 | Discharge: 2016-09-14 | Disposition: A | Discharge status: Home / Self Care | Payer: Medicare Other | Attending: Emergency Medicine | Admitting: Emergency Medicine

## 2016-09-13 ENCOUNTER — Encounter (HOSPITAL_COMMUNITY): Payer: Self-pay | Admitting: Emergency Medicine

## 2016-09-13 DIAGNOSIS — Y999 Unspecified external cause status: Secondary | ICD-10-CM | POA: Insufficient documentation

## 2016-09-13 DIAGNOSIS — N189 Chronic kidney disease, unspecified: Secondary | ICD-10-CM | POA: Diagnosis not present

## 2016-09-13 DIAGNOSIS — Z8541 Personal history of malignant neoplasm of cervix uteri: Secondary | ICD-10-CM | POA: Insufficient documentation

## 2016-09-13 DIAGNOSIS — D6489 Other specified anemias: Secondary | ICD-10-CM | POA: Insufficient documentation

## 2016-09-13 DIAGNOSIS — Y92002 Bathroom of unspecified non-institutional (private) residence single-family (private) house as the place of occurrence of the external cause: Secondary | ICD-10-CM | POA: Insufficient documentation

## 2016-09-13 DIAGNOSIS — Y939 Activity, unspecified: Secondary | ICD-10-CM | POA: Diagnosis not present

## 2016-09-13 DIAGNOSIS — D649 Anemia, unspecified: Secondary | ICD-10-CM

## 2016-09-13 DIAGNOSIS — S9032XA Contusion of left foot, initial encounter: Secondary | ICD-10-CM | POA: Diagnosis not present

## 2016-09-13 DIAGNOSIS — M7062 Trochanteric bursitis, left hip: Secondary | ICD-10-CM | POA: Diagnosis not present

## 2016-09-13 DIAGNOSIS — Z79899 Other long term (current) drug therapy: Secondary | ICD-10-CM | POA: Diagnosis not present

## 2016-09-13 DIAGNOSIS — W010XXA Fall on same level from slipping, tripping and stumbling without subsequent striking against object, initial encounter: Secondary | ICD-10-CM | POA: Insufficient documentation

## 2016-09-13 DIAGNOSIS — R52 Pain, unspecified: Secondary | ICD-10-CM

## 2016-09-13 DIAGNOSIS — W19XXXA Unspecified fall, initial encounter: Secondary | ICD-10-CM

## 2016-09-13 DIAGNOSIS — S99922A Unspecified injury of left foot, initial encounter: Secondary | ICD-10-CM | POA: Diagnosis present

## 2016-09-13 DIAGNOSIS — S7002XA Contusion of left hip, initial encounter: Secondary | ICD-10-CM | POA: Diagnosis not present

## 2016-09-13 MED ORDER — MORPHINE SULFATE (PF) 4 MG/ML IV SOLN: 4.0000 mg | Freq: Once | INTRAVENOUS | Status: DC

## 2016-09-13 NOTE — ED Provider Notes (Signed)
[facility] DEPT Provider Note   CSN: [id number] Arrival date & time: 09/13/16  2149  By signing my name below, I, Hansel Feinstein, attest that this documentation has been prepared under the direction and in the presence of  [address] [employer]. Electronically Signed: Hansel Feinstein, ED Scribe. 09/13/16. 11:23 PM.    History   Chief Complaint Chief Complaint  Patient presents with  . Fall    HPI Dorothy Polhemus is a 66 y.o. female with a PMHx of nephrectomy and ureteral stricture s/p nephrostomy, and remote cervical cancer (1974) with pelvic radiation, brought in by ambulance who presents to the Emergency Department complaining of L hip and L foot pain s/p mechanical fall that occurred at 7:30PM. Pt states she was getting up from the bathroom and trying to get to her walker but before reaching her walker she tripped and fell onto her L hip/foot onto the tile floor. States this was a mechanical fall, she denies lightheadedness/presyncope symptoms prior to falling; denies head injury or LOC. She describes her L hip pain as 5/10 constant, aching, L hip pain with radiation to L knee, worsened with standing/wt bearing or moving, and mildly improved with Oxycodone that she took at about 8pm. She is not on any anticoagulants/blood thinners. Denies wounds, bruising, head inj/LOC, lightheadedness, presyncope, recent fevers, chills, CP, SOB, abd pain, N/V, new/worsening diarrhea, constipation, hematuria, changes in urine, myalgias, arthralgias, numbness, tingling, new focal weakness, or any other complaints or injuries at this time. States she has chronic diarrhea at baseline, and chronic R sided weakness due to foot drop and pressure ulcer on the R foot.  The history is provided by the patient and medical records. No language interpreter was used.  Hip Pain  This is a new problem. The current episode started 3 to 5 hours ago. The problem occurs constantly. The problem has not changed since onset.Pertinent  negatives include no chest pain, no abdominal pain and no shortness of breath. The symptoms are aggravated by walking and standing. The symptoms are relieved by narcotics. Treatments tried: oxycodone. The treatment provided mild relief.    Past Medical History:  Diagnosis Date  . Cancer ([place])   . Sepsis [facility])     Patient Active Problem List   Diagnosis Date Noted  . Vancomycin resistant Enterococcus   . Urinary tract infection due to Enterococcus   . History of nephrectomy   . Hx of nephrostomy ([place])   . UTI (urinary tract infection) 03/31/2016  . Pressure injury of skin 03/31/2016  . Hypotension   . Anemia     Past Surgical History:  Procedure Laterality Date  . IR GENERIC HISTORICAL  04/01/2016   IR NEPHROSTOMY TUBE CHANGE 04/01/2016 Corrie Mckusick, DO [facility]-INTERV RAD    OB History    No data available       Home Medications    Prior to Admission medications   Medication Sig Start Date End Date Taking? Authorizing Provider  cholecalciferol (VITAMIN D) 400 units TABS tablet Take 400 Units by mouth daily.   Yes [provider]  ciprofloxacin (CIPRO) 250 MG tablet Take 250 mg by mouth 2 (two) times daily. 09/11/16 10/11/16 Yes [provider]  collagenase (SANTYL) ointment Apply 1 application topically daily.   Yes [provider]  diphenoxylate-atropine (LOMOTIL) 2.5-0.025 MG tablet Take 1 tablet by mouth 4 (four) times daily as needed for diarrhea or loose stools.   Yes [provider]  ferrous sulfate 325 (65 FE) MG tablet Take 325 mg  by mouth daily with breakfast.   Yes [provider]  gabapentin (NEURONTIN) 100 MG capsule Take 200 mg by mouth every morning.    Yes [provider]  gabapentin (NEURONTIN) 400 MG capsule Take 400 mg by mouth at bedtime.   Yes [provider]  loperamide (IMODIUM) 2 MG capsule Take 4 mg by mouth 3 (three) times daily.   Yes [provider]  magnesium oxide (MAG-OX) 400 MG  tablet Take 400 mg by mouth 2 (two) times daily.   Yes [provider]  mirtazapine (REMERON) 15 MG tablet Take 15 mg by mouth at bedtime.   Yes [provider]  Multiple Vitamin (MULTIVITAMIN) tablet Take 1 tablet by mouth daily.   Yes [provider]  oxyCODONE (OXY IR/ROXICODONE) 5 MG immediate release tablet Take 5 mg by mouth every 4 (four) hours as needed for severe pain.   Yes [provider]  sertraline (ZOLOFT) 25 MG tablet Take 25 mg by mouth daily. 07/08/16 07/08/17 Yes [provider]  sodium bicarbonate 650 MG tablet Take 1,300 mg by mouth 2 (two) times daily.    Yes [provider]  temazepam (RESTORIL) 30 MG capsule Take 30 mg by mouth at bedtime.   Yes [provider]  vitamin B-12 (CYANOCOBALAMIN) 100 MCG tablet Take 100 mcg by mouth daily.   Yes [provider]  linezolid (ZYVOX) 600 MG tablet Take 1 tablet (600 mg total) by mouth every 12 (twelve) hours. Patient not taking: Reported on 09/13/2016 04/05/16   Sela Hilding, MD    Family History History reviewed. No pertinent family history.  Social History Social History  Substance Use Topics  . Smoking status: Never Smoker  . Smokeless tobacco: Never Used  . Alcohol use Not on file     Allergies   Aspirin; Nsaids; Sulfa antibiotics; and Codeine   Review of Systems Review of Systems  Constitutional: Negative for chills and fever.  Respiratory: Negative for shortness of breath.   Cardiovascular: Negative for chest pain.  Gastrointestinal: Negative for abdominal pain, constipation, diarrhea, nausea and vomiting.  Genitourinary: Negative for decreased urine volume and hematuria.  Musculoskeletal: Positive for arthralgias (L hip and foot). Negative for back pain, myalgias and neck pain.  Skin: Negative for color change and wound.  Allergic/Immunologic: Positive for immunocompromised state.  Neurological: Negative for syncope, weakness,  light-headedness and numbness.  Hematological: Does not bruise/bleed easily.  Psychiatric/Behavioral: Negative for confusion.   A complete 10 system review of systems was obtained and all systems are negative except as noted in the HPI and PMH.    Physical Exam Updated Vital Signs BP (!) 118/55 (BP Location: Left Arm)   Pulse 66   Temp 98 F (36.7 C) (Oral)   Ht 5\' 4"  (1.626 m)   Wt 113 lb (51.3 kg)   SpO2 96%   BMI 19.40 kg/m   Physical Exam  Constitutional: She is oriented to person, place, and time. Vital signs are normal. She appears well-developed and well-nourished.  Non-toxic appearance. No distress.  Afebrile, nontoxic, NAD. Appears older than stated age.   HENT:  Head: Normocephalic and atraumatic. Head is without raccoon's eyes, without Battle's sign, without abrasion and without contusion.  Mouth/Throat: Oropharynx is clear and moist and mucous membranes are normal.  Brock/AT, no bruising/abrasions, no evidence of trauma  Eyes: Conjunctivae and EOM are normal. Right eye exhibits no discharge. Left eye exhibits no discharge.  Neck: Normal range of motion. Neck supple. No spinous process  tenderness and no muscular tenderness present. No neck rigidity. Normal range of motion present.  FROM intact without spinous process TTP, no bony stepoffs or deformities, no paraspinous muscle TTP or muscle spasms. No rigidity or meningeal signs. No bruising or swelling.   Cardiovascular: Normal rate, regular rhythm, normal heart sounds and intact distal pulses.  Exam reveals no gallop and no friction rub.   No murmur heard. Pulmonary/Chest: Effort normal and breath sounds normal. No respiratory distress. She has no decreased breath sounds. She has no wheezes. She has no rhonchi. She has no rales.  Abdominal: Soft. Normal appearance and bowel sounds are normal. She exhibits no distension. There is no tenderness. There is no rigidity, no rebound, no guarding, no CVA tenderness, no tenderness at  McBurney's point and negative Murphy's sign.  Musculoskeletal:       Left hip: She exhibits decreased range of motion (d/t pain), tenderness and bony tenderness. She exhibits no swelling, no crepitus, no deformity and no laceration.       Left foot: There is tenderness, bony tenderness and swelling. There is normal range of motion, normal capillary refill, no crepitus, no deformity and no laceration.  L hip with mildly limited ROM d/t pain, with mild lateral joint line TTP extending down to the greater trochanteric area, no bruising or swelling, no crepitus or deformity, no limb length discrepancy or abnormal rotation. +pain with log roll testing. Patient with significant pain during hip abduction and while attempting to weight bear through the left leg, although able to place some weight in the leg. Distal strength and sensation grossly intact, distal pulses intact, compartments soft.  R foot with brace and ace wrapping/bandaging in place.  Left foot with small bruise and mild swelling to the lateral aspect of the 5th metatarsal head, mild TTP in this area, skin intact, no crepitus or deformity, wiggles all digits. Cap refill brisk and present in all digits. C-spine as above, all other spinal levels nonTTP without bony stepoffs or deformities   Neurological: She is alert and oriented to person, place, and time. She has normal strength. No sensory deficit.  Skin: Skin is warm, dry and intact. Bruising noted. No rash noted.  L foot bruise as described above.   Psychiatric: She has a normal mood and affect.  Nursing note and vitals reviewed.    ED Treatments / Results   DIAGNOSTIC STUDIES: Oxygen Saturation is 96% on RA, adequate by my interpretation.    COORDINATION OF CARE: 11:09 PM Discussed treatment plan with pt at bedside which includes XR, CT, pain management and pt agreed to plan.    Labs (all labs ordered are listed, but only abnormal results are displayed) Labs Reviewed  CBC WITH  DIFFERENTIAL/PLATELET - Abnormal; Notable for the following:       Result Value   WBC 11.9 (*)    RBC 3.41 (*)    Hemoglobin 10.3 (*)    HCT 32.2 (*)    All other components within normal limits  BASIC METABOLIC PANEL - Abnormal; Notable for the following:    BUN 30 (*)    Creatinine, Ser 1.68 (*)    GFR calc non Af Amer 30 (*)    GFR calc Af Amer 35 (*)    All other components within normal limits  URINALYSIS, ROUTINE W REFLEX MICROSCOPIC - Abnormal; Notable for the following:    Leukocytes, UA LARGE (*)    Bacteria, UA MANY (*)    All other components within normal limits  URINE CULTURE    EKG  EKG Interpretation None       Radiology Ct Hip Left Wo Contrast  Result Date: 09/14/2016 CLINICAL DATA:  Left hip pain after slip and fall while walking out of the bathroom. EXAM: CT OF THE LEFT HIP WITHOUT CONTRAST TECHNIQUE: Multidetector CT imaging of the left hip was performed according to the standard protocol. Multiplanar CT image reconstructions were also generated. COMPARISON:  Radiographs earlier this day FINDINGS: Bones/Joint/Cartilage No acute fracture. Cortical margins of the left hip per intact. Femoral head is seated in the acetabulum. Pubic rami are intact. Joint spaces maintained. No evidence joint effusion. Ligaments Suboptimally assessed by CT. Muscles and Tendons No large intramuscular hematoma. Soft tissues No confluent hematoma.  Enteric sutures noted in the sigmoid colon. IMPRESSION: No evidence of occult fracture of the left hip. Electronically Signed   By: Jeb Levering M.D.   On: 09/14/2016 00:45   Dg Foot Complete Left  Result Date: 09/13/2016 CLINICAL DATA:  Lateral LEFT foot pain after a fall. EXAM: LEFT FOOT - COMPLETE 3+ VIEW COMPARISON:  Osteopenia. FINDINGS: There is no evidence of fracture or dislocation. There is no evidence of arthropathy or other focal bone abnormality. Soft tissues are unremarkable. Osteopenia. IMPRESSION: Negative for fracture or  dislocation. Electronically Signed   By: Staci Righter M.D.   On: 09/13/2016 22:46   Dg Hip Unilat With Pelvis 2-3 Views Left  Result Date: 09/13/2016 CLINICAL DATA:  66 y/o F; lateral left foot pain and left hip pain after fall. EXAM: DG HIP (WITH OR WITHOUT PELVIS) 2-3V LEFT COMPARISON:  None. FINDINGS: L5-S1 posterior instrumented fusion and laminectomy. Vascular stent projects over the right hemipelvis. Moderate osteoarthrosis of the sacroiliac joints. Mild bilateral hip osteoarthrosis with fibrocystic degeneration of the acetabular rim. No acute fracture or dislocation is identified. IMPRESSION: No acute fracture or dislocation identified. Electronically Signed   By: Kristine Garbe M.D.   On: 09/13/2016 22:42    Procedures Procedures (including critical care time)  Medications Ordered in ED Medications  morphine 4 MG/ML injection 4 mg (not administered)     Initial Impression / Assessment and Plan / ED Course  I have reviewed the triage vital signs and the nursing notes.  Pertinent labs & imaging results that were available during my care of the patient were reviewed by me and considered in my medical decision making (see chart for details).     66 y.o. female here with mechanical fall earlier tonight, tripped trying to get her walker and fell onto L hip and L foot. No head inj/LOC. Not on blood thinners. On exam, appears much older than stated age, small bruise and swelling to L lateral 5th metatarsal head with mild TTP, no crepitus or deformity; no spinal TTP, all extremities NVI; mild TTP to L lateral hip extending to the greater trochanter, pain with log roll testing, attempted to stand pt up and have her bear weight through her left leg and she couldn't due to pain. No limb length discrepancy or abnormal rotation of the leg. Xrays obtained in triage were unremarkable both of the L hip and the L foot. Given difficulty with weight bearing, will proceed with CT L hip to r/o  occult fx. Will give pain meds, and while we're getting that done will just draw basic labs while we're already sticking the pt; will also collect U/A (pt with nephrostomy bag). Will reassess shortly.   3:20 AM CBC w/diff with chronic anemia, WBC 11.9 but  sample actually looks hemoconcentrated; differential is unremarkable, so doubt truly relevant leukocytosis. BMP with stable kidney function, otherwise unremarkable. U/A clear with many bacteria and +leuks but neg nitrites, TNTC WBC; similar to visit from 03/31/16 although then she had cloudy urine with +nitrites and grew out VRE; I suspect today's sample is moreso a result of collection from the bag of urine that had been sitting for some time, and possibly colonization; doubt true UTI since pt has lack of any symptoms or changes in urine. Will send for culture but will hold off on treatment today. CT hip negative for occult fx.  Likely just contusions. Pt still able to place weight in the leg, so I feel we can safely send her home. Pt has walker at home, advised loose fitting shoes to help with foot pain; RICE advised, use of home pain meds as well. F/up with PCP in 1wk for recheck. I explained the diagnosis and have given explicit precautions to return to the ER including for any other new or worsening symptoms. The patient understands and accepts the medical plan as it's been dictated and I have answered their questions. Discharge instructions concerning home care and prescriptions have been given. The patient is STABLE and is discharged to home in good condition.   I personally performed the services described in this documentation, which was scribed in my presence. The recorded information has been reviewed and is accurate.   Final Clinical Impressions(s) / ED Diagnoses   Final diagnoses:  Fall, initial encounter  Contusion of left foot, initial encounter  Contusion of left hip, initial encounter  Chronic anemia  Chronic kidney disease,  unspecified CKD stage  Greater trochanteric bursitis of left hip    New Prescriptions New Prescriptions   No medications on [address] [place], [place] 09/14/16 0321    Tegeler, Gwenyth Allegra, MD 09/14/16 1318

## 2016-09-13 NOTE — ED Triage Notes (Signed)
Per EMS pt was walking out of the bathroom with her walker when she slipped and fell onto her left hip. Pt states she is not on blood thinners and denies hitting her head or LOC. Denies dizziness as well, states she just slipped.  Pt is alert and oriented x 4. No obvious deformity noted.

## 2016-09-14 DIAGNOSIS — S9032XA Contusion of left foot, initial encounter: Secondary | ICD-10-CM | POA: Diagnosis not present

## 2016-09-14 LAB — BASIC METABOLIC PANEL
ANION GAP: 8 (ref 5–15)
BUN: 30 mg/dL — ABNORMAL HIGH (ref 6–20)
CHLORIDE: 107 mmol/L (ref 101–111)
CO2: 25 mmol/L (ref 22–32)
CREATININE: 1.68 mg/dL — AB (ref 0.44–1.00)
Calcium: 9.7 mg/dL (ref 8.9–10.3)
GFR calc non Af Amer: 30 mL/min — ABNORMAL LOW (ref >60)
GFR, EST AFRICAN AMERICAN: 35 mL/min — AB (ref >60)
Glucose, Bld: 85 mg/dL (ref 65–99)
POTASSIUM: 4.6 mmol/L (ref 3.5–5.1)
SODIUM: 140 mmol/L (ref 135–145)

## 2016-09-14 LAB — CBC WITH DIFFERENTIAL/PLATELET
BASOS ABS: 0 10*3/uL (ref 0.0–0.1)
BASOS PCT: 0 %
EOS ABS: 0.2 10*3/uL (ref 0.0–0.7)
Eosinophils Relative: 2 %
HEMATOCRIT: 32.2 % — AB (ref 36.0–46.0)
Hemoglobin: 10.3 g/dL — ABNORMAL LOW (ref 12.0–15.0)
Lymphocytes Relative: 31 %
Lymphs Abs: 3.7 10*3/uL (ref 0.7–4.0)
MCH: 30.2 pg (ref 26.0–34.0)
MCHC: 32 g/dL (ref 30.0–36.0)
MCV: 94.4 fL (ref 78.0–100.0)
Monocytes Absolute: 0.8 10*3/uL (ref 0.1–1.0)
Monocytes Relative: 7 %
NEUTROS ABS: 7.2 10*3/uL (ref 1.7–7.7)
Neutrophils Relative %: 60 %
Platelets: 202 10*3/uL (ref 150–400)
RBC: 3.41 MIL/uL — AB (ref 3.87–5.11)
RDW: 13 % (ref 11.5–15.5)
WBC: 11.9 10*3/uL — AB (ref 4.0–10.5)

## 2016-09-14 LAB — URINALYSIS, ROUTINE W REFLEX MICROSCOPIC
BILIRUBIN URINE: NEGATIVE
Glucose, UA: NEGATIVE mg/dL
HGB URINE DIPSTICK: NEGATIVE
KETONES UR: NEGATIVE mg/dL
NITRITE: NEGATIVE
Protein, ur: NEGATIVE mg/dL
SPECIFIC GRAVITY, URINE: 1.005 (ref 1.005–1.030)
Squamous Epithelial / LPF: NONE SEEN
pH: 6 (ref 5.0–8.0)

## 2016-09-14 MED ORDER — HEPARIN SOD (PORK) LOCK FLUSH 100 UNIT/ML IV SOLN
500.0000 [IU] | Freq: Once | INTRAVENOUS | Status: AC
  Administered 2016-09-14: 500 [IU]
  Filled 2016-09-14: qty 5

## 2016-09-14 MED ORDER — SODIUM CHLORIDE 0.9% FLUSH
10.0000 mL | Freq: Once | INTRAVENOUS | Status: AC
  Administered 2016-09-14: 10 mL via INTRAVENOUS

## 2016-09-14 NOTE — Discharge Instructions (Signed)
Continue to use your walker to help with walking. Wear loose fitting shoes on the left foot to help with pain. Ice and elevate foot/hip throughout the day, using ice pack for no more than 20 minutes every hour.  Use home pain medications and ibuprofen/NSAIDs for pain relief. Do not drive or operate machinery with pain medication use. Follow up with your regular doctor in 1 week for recheck of symptoms. Return to the ER for changes or worsening symptoms.

## 2016-09-14 NOTE — ED Notes (Signed)
Patient continues to state she is not in pain at the present and does not need pain medications at this time.

## 2016-09-14 NOTE — ED Notes (Signed)
Patient transported to CT 

## 2016-09-14 NOTE — ED Notes (Signed)
Patients grandson would like to be called with update on patient. [phone]

## 2016-09-16 LAB — URINE CULTURE: Culture: >=100000 — AB

## 2016-09-17 ENCOUNTER — Telehealth: Payer: Self-pay | Admitting: Emergency Medicine

## 2016-09-17 NOTE — Telephone Encounter (Signed)
Post ED Visit - Positive Culture Follow-up  Culture report reviewed by antimicrobial stewardship pharmacist:  []  Elenor Quinones, Pharm.D. [x]  Heide Guile, Pharm.D., BCPS AQ-ID []  Parks Neptune, Pharm.D., BCPS []  Alycia Rossetti, Pharm.D., BCPS []  [place], [place].D., BCPS, AAHIVP []  Legrand Como, Pharm.D., BCPS, AAHIVP []  Salome Arnt, PharmD, BCPS []  Dimitri Ped, PharmD, BCPS []  Vincenza Hews, PharmD, BCPS  Positive urine culture Treated with cipro, organism sensitive to the same and no further patient follow-up is required at this time.  Hazle Nordmann 09/17/2016, 4:13 PM

## 2017-04-03 ENCOUNTER — Encounter (HOSPITAL_COMMUNITY): Payer: Self-pay

## 2017-04-03 ENCOUNTER — Emergency Department (HOSPITAL_COMMUNITY)
Admission: EM | Admit: 2017-04-03 | Discharge: 2017-04-03 | Disposition: A | Discharge status: Home / Self Care | Payer: Medicare Other | Attending: Emergency Medicine | Admitting: Emergency Medicine

## 2017-04-03 ENCOUNTER — Other Ambulatory Visit: Payer: Self-pay

## 2017-04-03 ENCOUNTER — Emergency Department (HOSPITAL_COMMUNITY): Payer: Medicare Other

## 2017-04-03 DIAGNOSIS — M25551 Pain in right hip: Secondary | ICD-10-CM | POA: Diagnosis not present

## 2017-04-03 DIAGNOSIS — M25561 Pain in right knee: Secondary | ICD-10-CM | POA: Insufficient documentation

## 2017-04-03 DIAGNOSIS — Z79899 Other long term (current) drug therapy: Secondary | ICD-10-CM | POA: Diagnosis not present

## 2017-04-03 DIAGNOSIS — W19XXXA Unspecified fall, initial encounter: Secondary | ICD-10-CM

## 2017-04-03 LAB — BASIC METABOLIC PANEL
Anion gap: 7 (ref 5–15)
BUN: 22 mg/dL — AB (ref 6–20)
CHLORIDE: 110 mmol/L (ref 101–111)
CO2: 24 mmol/L (ref 22–32)
CREATININE: 1.75 mg/dL — AB (ref 0.44–1.00)
Calcium: 9 mg/dL (ref 8.9–10.3)
GFR calc Af Amer: 33 mL/min — ABNORMAL LOW (ref >60)
GFR, EST NON AFRICAN AMERICAN: 29 mL/min — AB (ref >60)
Glucose, Bld: 81 mg/dL (ref 65–99)
Potassium: 3.7 mmol/L (ref 3.5–5.1)
SODIUM: 141 mmol/L (ref 135–145)

## 2017-04-03 LAB — CBC WITH DIFFERENTIAL/PLATELET
Basophils Absolute: 0 10*3/uL (ref 0.0–0.1)
Basophils Relative: 0 %
EOS ABS: 0.3 10*3/uL (ref 0.0–0.7)
EOS PCT: 3 %
HCT: 29 % — ABNORMAL LOW (ref 36.0–46.0)
Hemoglobin: 9.4 g/dL — ABNORMAL LOW (ref 12.0–15.0)
LYMPHS ABS: 3.7 10*3/uL (ref 0.7–4.0)
Lymphocytes Relative: 38 %
MCH: 30.9 pg (ref 26.0–34.0)
MCHC: 32.4 g/dL (ref 30.0–36.0)
MCV: 95.4 fL (ref 78.0–100.0)
MONO ABS: 0.7 10*3/uL (ref 0.1–1.0)
Monocytes Relative: 7 %
Neutro Abs: 5.1 10*3/uL (ref 1.7–7.7)
Neutrophils Relative %: 52 %
PLATELETS: 210 10*3/uL (ref 150–400)
RBC: 3.04 MIL/uL — AB (ref 3.87–5.11)
RDW: 14 % (ref 11.5–15.5)
WBC: 9.8 10*3/uL (ref 4.0–10.5)

## 2017-04-03 LAB — TROPONIN I

## 2017-04-03 MED ORDER — HEPARIN SOD (PORK) LOCK FLUSH 100 UNIT/ML IV SOLN
500.0000 [IU] | Freq: Once | INTRAVENOUS | Status: AC
  Administered 2017-04-03: 500 [IU]
  Filled 2017-04-03: qty 5

## 2017-04-03 NOTE — ED Notes (Signed)
Bed: IO96 Expected date:  Expected time:  Means of arrival:  Comments: Hold for hall D

## 2017-04-03 NOTE — Discharge Instructions (Signed)
Please read attached information regarding your condition. Continue your home medications as previously prescribed. Follow-up with orthopedic as listed below for further evaluation. Wear knee immobilizer as directed and use walker. Return to ED for additional falls, head injuries or loss of consciousness, numbness in legs, chest pain or shortness of breath.

## 2017-04-03 NOTE — ED Provider Notes (Signed)
[facility] DEPT Provider Note   CSN: [id number] Arrival date & time: 04/03/17  1344     History   Chief Complaint Chief Complaint  Patient presents with  . Knee Pain  . Hip Pain    HPI Rhonda Price is a 66 y.o. female who presents to ED for evaluation of mechanical fall that occurred last night at approximately 18 hours ago.  Her daughter states that she was trying to turn around with her walker when she slipped.  She has been complaining of right knee pain and left hip bruising.  Her daughter reports some improvement with ice yesterday.  However, when her physical therapist came to their home earlier today, they stated that she was not willing to move her right knee at all.  She states that pain is worse with movement.  Reports some improvement with Oxycodone that she took earlier today. She has not tried ambulating since the accident.  Her daughter states that she is usually active with a walker at home and denies any previous falls.  She denies any previous fracture, dislocations or procedures in the area.  She denies any blood thinner use, head injury, loss of consciousness or vision changes. She also reports slight chest pain with inspiration.  States that this began after the fall yesterday.  She denies any associated symptoms including no SOB, cough, hemoptysis. Of note, patient was recently discharged from rehab facility earlier in the week.  She was here for conditioning after being admitted for sepsis.  HPI  Past Medical History:  Diagnosis Date  . Cancer ([place])   . Sepsis [facility])     Patient Active Problem List   Diagnosis Date Noted  . Vancomycin resistant Enterococcus   . Urinary tract infection due to Enterococcus   . History of nephrectomy   . 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 UTI (urinary tract infection) 03/31/2016  . Pressure injury of skin 03/31/2016  . Hypotension   . Anemia     Past Surgical History:  Procedure Laterality Date    . CHOLECYSTECTOMY    . IR GENERIC HISTORICAL  04/01/2016   IR NEPHROSTOMY TUBE CHANGE 04/01/2016 Corrie Mckusick, DO [facility]-INTERV RAD    OB History    No data available       Home Medications    Prior to Admission medications   Medication Sig Start Date End Date Taking? Authorizing Provider  cholecalciferol (VITAMIN D) 400 units TABS tablet Take 400 Units by mouth daily.    [provider]  collagenase (SANTYL) ointment Apply 1 application topically daily.    [provider]  diphenoxylate-atropine (LOMOTIL) 2.5-0.025 MG tablet Take 1 tablet by mouth 4 (four) times daily as needed for diarrhea or loose stools.    [provider]  ferrous sulfate 325 (65 FE) MG tablet Take 325 mg by mouth daily with breakfast.    [provider]  gabapentin (NEURONTIN) 100 MG capsule Take 200 mg by mouth every morning.     [provider]  gabapentin (NEURONTIN) 400 MG capsule Take 400 mg by mouth at bedtime.    [provider]  linezolid (ZYVOX) 600 MG tablet Take 1 tablet (600 mg total) by mouth every 12 (twelve) hours. Patient not taking: Reported on 09/13/2016 04/05/16   Sela Hilding, MD  loperamide (IMODIUM) 2 MG capsule Take 4 mg by mouth 3 (three) times daily.    [provider]  magnesium oxide (MAG-OX) 400 MG tablet Take 400 mg  by mouth 2 (two) times daily.    [provider]  mirtazapine (REMERON) 15 MG tablet Take 15 mg by mouth at bedtime.    [provider]  Multiple Vitamin (MULTIVITAMIN) tablet Take 1 tablet by mouth daily.    [provider]  oxyCODONE (OXY IR/ROXICODONE) 5 MG immediate release tablet Take 5 mg by mouth every 4 (four) hours as needed for severe pain.    [provider]  sertraline (ZOLOFT) 25 MG tablet Take 25 mg by mouth daily. 07/08/16 07/08/17  [provider]  sodium bicarbonate 650 MG tablet Take 1,300 mg by mouth 2 (two) times daily.     [provider]   temazepam (RESTORIL) 30 MG capsule Take 30 mg by mouth at bedtime.    [provider]  vitamin B-12 (CYANOCOBALAMIN) 100 MCG tablet Take 100 mcg by mouth daily.    [provider]    Family History History reviewed. No pertinent family history.  Social History Social History   Tobacco Use  . Smoking status: Never Smoker  . Smokeless tobacco: Never Used  Substance Use Topics  . Alcohol use: No    Frequency: Never  . Drug use: No     Allergies   Aspirin; Nsaids; Sulfa antibiotics; and Codeine   Review of Systems Review of Systems  Constitutional: Negative for appetite change, chills and fever.  HENT: Negative for ear pain, rhinorrhea, sneezing and sore throat.   Eyes: Negative for photophobia and visual disturbance.  Respiratory: Negative for cough, chest tightness, shortness of breath and wheezing.   Cardiovascular: Positive for chest pain. Negative for palpitations.  Gastrointestinal: Negative for abdominal pain, blood in stool, constipation, diarrhea, nausea and vomiting.  Genitourinary: Negative for dysuria, hematuria and urgency.  Musculoskeletal: Positive for arthralgias, gait problem and myalgias.  Skin: Negative for rash.  Neurological: Negative for dizziness, weakness, light-headedness and headaches.     Physical Exam Updated Vital Signs BP 94/79 (BP Location: Left Arm)   Pulse 74   Temp 98.4 F (36.9 C) (Oral)   Resp 14   Ht 5\' 4"  (1.626 m)   Wt 51.3 kg (113 lb)   SpO2 95%   BMI 19.40 kg/m   Physical Exam  Constitutional: She appears well-developed and well-nourished. No distress.  HENT:  Head: Normocephalic and atraumatic.  Nose: Nose normal.  Eyes: Conjunctivae and EOM are normal. Right eye exhibits no discharge. Left eye exhibits no discharge. No scleral icterus.  Neck: Normal range of motion. Neck supple.  Cardiovascular: Normal rate, regular rhythm, normal heart sounds and intact distal pulses. Exam reveals no gallop and no  friction rub.  No murmur heard. Pulmonary/Chest: Effort normal and breath sounds normal. No respiratory distress. She exhibits no tenderness.  Abdominal: Soft. Bowel sounds are normal. She exhibits no distension. There is no tenderness. There is no guarding.  Musculoskeletal: Normal range of motion. She exhibits edema and tenderness. She exhibits no deformity.  Tenderness to palpation of the medial aspect of the right knee.  Unable to perform active flexion and extension secondary to pain.  No visible deformity, color or temperature change noted. Tenderness to palpation of the left hip but otherwise full active and passive range of motion.  Neurological: She is alert. No cranial nerve deficit or sensory deficit. She exhibits normal muscle tone. Coordination normal.  Pupils reactive. No facial asymmetry noted. Cranial nerves appear grossly intact. Sensation intact to light touch on face, BUE and BLE. Strength 5/5 in BUE and BLE. Normal  finger to nose coordination bilaterally.  Skin: Skin is warm and dry. No rash noted.  Psychiatric: She has a normal mood and affect.  Nursing note and vitals reviewed.    ED Treatments / Results  Labs (all labs ordered are listed, but only abnormal results are displayed) Labs Reviewed  BASIC METABOLIC PANEL - Abnormal; Notable for the following components:      Result Value   BUN 22 (*)    Creatinine, Ser 1.75 (*)    GFR calc non Af Amer 29 (*)    GFR calc Af Amer 33 (*)    All other components within normal limits  CBC WITH DIFFERENTIAL/PLATELET - Abnormal; Notable for the following components:   RBC 3.04 (*)    Hemoglobin 9.4 (*)    HCT 29.0 (*)    All other components within normal limits  TROPONIN I  I-STAT TROPONIN, ED    EKG  EKG Interpretation None       Radiology Dg Chest 2 View  Result Date: 04/03/2017 CLINICAL DATA:  Knee and hip pain after fall. EXAM: CHEST  2 VIEW COMPARISON:  03/31/2016 FINDINGS: Chronic interstitial  coarsening. Borderline heart size that is stable. Negative mediastinal contours. Porta catheter on the left with tip at the upper cavoatrial junction. IMPRESSION: No acute finding when compared to prior. Electronically Signed   By: Monte Fantasia M.D.   On: 04/03/2017 17:14   Ct Knee Right Wo Contrast  Result Date: 04/03/2017 CLINICAL DATA:  Fall with knee pain and swelling. EXAM: CT OF THE RIGHT KNEE WITHOUT CONTRAST TECHNIQUE: Multidetector CT imaging of the right knee was performed according to the standard protocol. Multiplanar CT image reconstructions were also generated. COMPARISON:  Plain film exam from earlier the same day FINDINGS: Bones/Joint/Cartilage No fracture in the distal femur or proximal tibia/ fibula. The patella is intact. No joint effusion. Loss of joint space is noted in the medial compartment and to a lesser degree in the lateral compartment. Ligaments Suboptimally assessed by CT. Muscles and Tendons Muscles about the knee appear atrophic. Soft tissues No evidence for soft tissue hematoma. IMPRESSION: 1. No fracture evident by CT in this osteopenic patient. No joint effusion. 2. Follow-up MRI of the knee may prove helpful to further evaluate as bone contusion and ligamentous/meniscal injuries can be occult on CT. Electronically Signed   By: Misty Stanley M.D.   On: 04/03/2017 19:18   Dg Knee Complete 4 Views Right  Result Date: 04/03/2017 CLINICAL DATA:  66 y/o  F; fall with pain swelling. EXAM: RIGHT KNEE - COMPLETE 4+ VIEW COMPARISON:  None. FINDINGS: No evidence of fracture, dislocation, or joint effusion. Joint spaces are maintained. Small tricompartmental periarticular osteophytes and tibial spine spurring. IMPRESSION: 1.  No acute fracture or dislocation identified. 2. Minimal tricompartmental osteoarthrosis. Electronically Signed   By: Kristine Garbe M.D.   On: 04/03/2017 17:14   Dg Hip Unilat W Or Wo Pelvis 2-3 Views Left  Result Date: 04/03/2017 CLINICAL  DATA:  Left hip pain after a fall. EXAM: DG HIP (WITH OR WITHOUT PELVIS) 2-3V LEFT COMPARISON:  09/13/2016 FINDINGS: AP view the pelvis and AP/lateral views of the left hip. Lumbosacral spine fixation. Sacroiliac joints are symmetric. Mild osteopenia. Femoral heads are located. No acute fracture. IMPRESSION: No acute osseous abnormality. Electronically Signed   By: Abigail Miyamoto M.D.   On: 04/03/2017 17:11    Procedures Procedures (including critical care time)  Medications Ordered in ED Medications - No data to display  Initial Impression / Assessment and Plan / ED Course  I have reviewed the triage vital signs and the nursing notes.  Pertinent labs & imaging results that were available during my care of the patient were reviewed by me and considered in my medical decision making (see chart for details).     Patient presents to ED for evaluation of mechanical fall that occurred last night.  She was trying to turn around with her walker when she slipped.  She has been complaining of right knee pain and left hip pain since the fall as well as pain with ambulation and movement of hip.  She also began telling me that she was having chest pain since yesterday.  On physical exam patient is nontoxic-appearing and in no acute distress.  She has no signs of head injury or any focal deficits on her neurological exam.  The lungs are clear to auscultation bilaterally and patient is satting at 96-98% on room air.  She is afebrile with no history of fever.  She does have tenderness to palpation of the medial aspect of the right knee and is unable to perform range of motion exercises secondary to pain. There is no visible deformity, color change, temperature change or joint swelling that could concern me for septic joint. X-rays of hip, chest, knee were all unremarkable.  CT of the knee was also negative.  Her lab work including troponin, CBC unremarkable.  BMP did show stable kidney function. EKG unremarkable.  Her symptoms could be due to a ligamentous or meniscal injury.  We will give knee immobilizer and advised her to follow-up with orthopedist for further evaluation.  Advised her to continue her home medications for pain as needed.  Patient appears stable for discharge at this time.  Strict return precautions given.  Patient discussed with and seen by Dr. Zenia Resides.  Final Clinical Impressions(s) / ED Diagnoses   Final diagnoses:  Fall, initial encounter  Acute pain of right knee    ED Discharge Orders    None       Delia Heady, PA-C 04/03/17 2053    Lacretia Leigh, MD 04/07/17 [phone]

## 2017-04-03 NOTE — ED Triage Notes (Signed)
Pt BIB EMS from home. Pt had a witnessed fall/(slide to the floor) last night. Pt c/o right knee pain and swelling and left hip pain. Pt was recently hospitalized for sepsis and d/c from rehab facility recently. Pt has been increasingly weak since and has experienced two falls since coming home. No blood thinners, denies hitting head or LOC. Pt A/Ox4, daughter at bedside.

## 2017-04-03 NOTE — ED Notes (Signed)
Bed: [facility] Expected date:  Expected time:  Means of arrival:  Comments: EMS-fall-hip pain 

## 2017-04-03 NOTE — ED Notes (Signed)
Patient transported to X-ray 

## 2017-04-03 NOTE — ED Notes (Signed)
Tressa-Daughter-[phone]

## 2017-08-03 ENCOUNTER — Emergency Department (HOSPITAL_COMMUNITY): Payer: Medicare Other

## 2017-08-03 ENCOUNTER — Other Ambulatory Visit: Payer: Self-pay

## 2017-08-03 ENCOUNTER — Inpatient Hospital Stay (HOSPITAL_COMMUNITY)
Admission: EM | Admit: 2017-08-03 | Discharge: 2017-09-03 | DRG: 871 | Disposition: E | Payer: Medicare Other | Attending: Family Medicine | Admitting: Family Medicine

## 2017-08-03 DIAGNOSIS — Z515 Encounter for palliative care: Secondary | ICD-10-CM | POA: Diagnosis present

## 2017-08-03 DIAGNOSIS — N179 Acute kidney failure, unspecified: Secondary | ICD-10-CM

## 2017-08-03 DIAGNOSIS — Z885 Allergy status to narcotic agent status: Secondary | ICD-10-CM

## 2017-08-03 DIAGNOSIS — A419 Sepsis, unspecified organism: Principal | ICD-10-CM | POA: Diagnosis present

## 2017-08-03 DIAGNOSIS — Z882 Allergy status to sulfonamides status: Secondary | ICD-10-CM | POA: Diagnosis not present

## 2017-08-03 DIAGNOSIS — R6521 Severe sepsis with septic shock: Secondary | ICD-10-CM | POA: Diagnosis present

## 2017-08-03 DIAGNOSIS — K5900 Constipation, unspecified: Secondary | ICD-10-CM | POA: Diagnosis present

## 2017-08-03 DIAGNOSIS — I213 ST elevation (STEMI) myocardial infarction of unspecified site: Secondary | ICD-10-CM | POA: Diagnosis present

## 2017-08-03 DIAGNOSIS — R230 Cyanosis: Secondary | ICD-10-CM | POA: Diagnosis present

## 2017-08-03 DIAGNOSIS — L89152 Pressure ulcer of sacral region, stage 2: Secondary | ICD-10-CM | POA: Diagnosis present

## 2017-08-03 DIAGNOSIS — Z933 Colostomy status: Secondary | ICD-10-CM

## 2017-08-03 DIAGNOSIS — Z79899 Other long term (current) drug therapy: Secondary | ICD-10-CM

## 2017-08-03 DIAGNOSIS — Z886 Allergy status to analgesic agent status: Secondary | ICD-10-CM | POA: Diagnosis not present

## 2017-08-03 DIAGNOSIS — E876 Hypokalemia: Secondary | ICD-10-CM | POA: Diagnosis present

## 2017-08-03 DIAGNOSIS — D649 Anemia, unspecified: Secondary | ICD-10-CM | POA: Diagnosis present

## 2017-08-03 DIAGNOSIS — R579 Shock, unspecified: Secondary | ICD-10-CM

## 2017-08-03 DIAGNOSIS — L8990 Pressure ulcer of unspecified site, unspecified stage: Secondary | ICD-10-CM | POA: Diagnosis not present

## 2017-08-03 DIAGNOSIS — I998 Other disorder of circulatory system: Secondary | ICD-10-CM | POA: Diagnosis present

## 2017-08-03 DIAGNOSIS — R6889 Other general symptoms and signs: Secondary | ICD-10-CM | POA: Diagnosis present

## 2017-08-03 DIAGNOSIS — J189 Pneumonia, unspecified organism: Secondary | ICD-10-CM | POA: Diagnosis present

## 2017-08-03 DIAGNOSIS — E871 Hypo-osmolality and hyponatremia: Secondary | ICD-10-CM | POA: Diagnosis present

## 2017-08-03 DIAGNOSIS — R569 Unspecified convulsions: Secondary | ICD-10-CM | POA: Diagnosis present

## 2017-08-03 DIAGNOSIS — Z8541 Personal history of malignant neoplasm of cervix uteri: Secondary | ICD-10-CM

## 2017-08-03 DIAGNOSIS — J9601 Acute respiratory failure with hypoxia: Secondary | ICD-10-CM

## 2017-08-03 DIAGNOSIS — I70229 Atherosclerosis of native arteries of extremities with rest pain, unspecified extremity: Secondary | ICD-10-CM

## 2017-08-03 DIAGNOSIS — Z66 Do not resuscitate: Secondary | ICD-10-CM | POA: Diagnosis present

## 2017-08-03 LAB — COMPREHENSIVE METABOLIC PANEL
ALT: 48 U/L (ref 14–54)
AST: 37 U/L (ref 15–41)
Albumin: 2.6 g/dL — ABNORMAL LOW (ref 3.5–5.0)
Alkaline Phosphatase: 222 U/L — ABNORMAL HIGH (ref 38–126)
Anion gap: 27 — ABNORMAL HIGH (ref 5–15)
BUN: 148 mg/dL — AB (ref 6–20)
CHLORIDE: 87 mmol/L — AB (ref 101–111)
CO2: 8 mmol/L — AB (ref 22–32)
CREATININE: 12.13 mg/dL — AB (ref 0.44–1.00)
Calcium: 8.3 mg/dL — ABNORMAL LOW (ref 8.9–10.3)
GFR calc non Af Amer: 3 mL/min — ABNORMAL LOW (ref >60)
GFR, EST AFRICAN AMERICAN: 3 mL/min — AB (ref >60)
GLUCOSE: 146 mg/dL — AB (ref 65–99)
Potassium: 3.3 mmol/L — ABNORMAL LOW (ref 3.5–5.1)
SODIUM: 122 mmol/L — AB (ref 135–145)
TOTAL PROTEIN: 7.8 g/dL (ref 6.5–8.1)
Total Bilirubin: 1.2 mg/dL (ref 0.3–1.2)

## 2017-08-03 LAB — CBC
HEMATOCRIT: 24.8 % — AB (ref 36.0–46.0)
Hemoglobin: 8.6 g/dL — ABNORMAL LOW (ref 12.0–15.0)
MCH: 29.9 pg (ref 26.0–34.0)
MCHC: 34.7 g/dL (ref 30.0–36.0)
MCV: 86.1 fL (ref 78.0–100.0)
PLATELETS: 296 10*3/uL (ref 150–400)
RBC: 2.88 MIL/uL — ABNORMAL LOW (ref 3.87–5.11)
RDW: 13.2 % (ref 11.5–15.5)
WBC: 24.9 10*3/uL — AB (ref 4.0–10.5)

## 2017-08-03 LAB — I-STAT CHEM 8, ED
BUN: 132 mg/dL — AB (ref 6–20)
CALCIUM ION: 1 mmol/L — AB (ref 1.15–1.40)
CHLORIDE: 91 mmol/L — AB (ref 101–111)
Creatinine, Ser: 13.3 mg/dL — ABNORMAL HIGH (ref 0.44–1.00)
Glucose, Bld: 148 mg/dL — ABNORMAL HIGH (ref 65–99)
HCT: 25 % — ABNORMAL LOW (ref 36.0–46.0)
Hemoglobin: 8.5 g/dL — ABNORMAL LOW (ref 12.0–15.0)
Potassium: 3.2 mmol/L — ABNORMAL LOW (ref 3.5–5.1)
Sodium: 123 mmol/L — ABNORMAL LOW (ref 135–145)
TCO2: 10 mmol/L — AB (ref 22–32)

## 2017-08-03 LAB — I-STAT CG4 LACTIC ACID, ED: Lactic Acid, Venous: 3.47 mmol/L (ref 0.5–1.9)

## 2017-08-03 LAB — I-STAT TROPONIN, ED: Troponin i, poc: 0.01 ng/mL (ref 0.00–0.08)

## 2017-08-03 MED ORDER — OXYCODONE HCL 5 MG PO TABS
5.0000 mg | ORAL_TABLET | ORAL | Status: DC | PRN
  Administered 2017-08-03: 5 mg via ORAL
  Filled 2017-08-03: qty 1

## 2017-08-03 MED ORDER — ONDANSETRON HCL 4 MG/2ML IJ SOLN: 4.0000 mg | Freq: Four times a day (QID) | INTRAMUSCULAR | Status: DC | PRN

## 2017-08-03 MED ORDER — ACETAMINOPHEN 650 MG RE SUPP: 650.0000 mg | Freq: Four times a day (QID) | RECTAL | Status: DC | PRN

## 2017-08-03 MED ORDER — ALBUTEROL SULFATE (2.5 MG/3ML) 0.083% IN NEBU: 2.5000 mg | INHALATION_SOLUTION | RESPIRATORY_TRACT | Status: DC | PRN

## 2017-08-03 MED ORDER — POLYVINYL ALCOHOL 1.4 % OP SOLN: 1.0000 [drp] | Freq: Four times a day (QID) | OPHTHALMIC | Status: DC | PRN

## 2017-08-03 MED ORDER — LORAZEPAM 2 MG/ML IJ SOLN
1.0000 mg | INTRAMUSCULAR | Status: DC | PRN
  Filled 2017-08-03: qty 1

## 2017-08-03 MED ORDER — HYDROMORPHONE HCL 1 MG/ML IJ SOLN
1.0000 mg | INTRAMUSCULAR | Status: DC | PRN
  Administered 2017-08-03 – 2017-08-04 (×5): 1 mg via INTRAVENOUS
  Filled 2017-08-03 (×6): qty 1

## 2017-08-03 MED ORDER — SODIUM CHLORIDE 0.9 % IV BOLUS
1000.0000 mL | Freq: Once | INTRAVENOUS | Status: AC
  Administered 2017-08-03: 1000 mL via INTRAVENOUS

## 2017-08-03 MED ORDER — ONDANSETRON 4 MG PO TBDP: 4.0000 mg | ORAL_TABLET | Freq: Four times a day (QID) | ORAL | Status: DC | PRN

## 2017-08-03 MED ORDER — SODIUM CHLORIDE 0.9 % IV SOLN: INTRAVENOUS | Status: DC

## 2017-08-03 MED ORDER — SENNA 8.6 MG PO TABS: 1.0000 | ORAL_TABLET | Freq: Every evening | ORAL | Status: DC | PRN

## 2017-08-03 MED ORDER — ACETAMINOPHEN 325 MG PO TABS: 650.0000 mg | ORAL_TABLET | Freq: Four times a day (QID) | ORAL | Status: DC | PRN

## 2017-08-03 NOTE — ED Triage Notes (Signed)
Pt to ED via [place], with staff c/o pt had circumoral cyanosis and poor cap refill this am,.  On arrival to ED - pt is hypotensive, cool to touch, but alert/ pale.  Port a cath accessed on arrival per Tonita Cong, Therapist, sports.  Pt has ileostomy/colostomy,

## 2017-08-03 NOTE — ED Provider Notes (Signed)
[facility] EMERGENCY DEPARTMENT Provider Note   CSN: [id number] Arrival date & time: 07/11/2017  1051     History   Chief Complaint Chief Complaint  Patient presents with  . Hypotension   Level 5 caveat acuity of situation unstable vital signs HPI Rhonda Price is a 67 y.o. female.  History obtained from [facility], nurse at [facility] care skilled nursing facility.  Patient became short of breath this morning and had pulse oximetry of 75% on room air.  She was treated with a DuoNeb and supplemental oxygen.  She was brought by EMS.  Presently she complains of "pain in my butt" no other complaint. HPI Patient brought by EMS Past Medical History:  Diagnosis Date  . Cancer ([place])   . Sepsis [facility])     Patient Active Problem List   Diagnosis Date Noted  . Vancomycin resistant Enterococcus   . Urinary tract infection due to Enterococcus   . History of nephrectomy   . Hx of nephrostomy ([place])   . UTI (urinary tract infection) 03/31/2016  . Pressure injury of skin 03/31/2016  . Hypotension   . Anemia     Past Surgical History:  Procedure Laterality Date  . CHOLECYSTECTOMY    . IR GENERIC HISTORICAL  04/01/2016   IR NEPHROSTOMY TUBE CHANGE 04/01/2016 Corrie Mckusick, DO [facility]-INTERV RAD     OB History   None      Home Medications    Prior to Admission medications   Medication Sig Start Date End Date Taking? Authorizing Provider  cholecalciferol (VITAMIN D) 400 units TABS tablet Take 400 Units by mouth daily.    [provider]  collagenase (SANTYL) ointment Apply 1 application topically daily.    [provider]  diphenoxylate-atropine (LOMOTIL) 2.5-0.025 MG tablet Take 1 tablet by mouth 4 (four) times daily as needed for diarrhea or loose stools.    [provider]  ferrous sulfate 325 (65 FE) MG tablet Take 325 mg by mouth daily with breakfast.    [provider]  gabapentin (NEURONTIN) 100 MG capsule Take 200 mg  by mouth every morning.     [provider]  gabapentin (NEURONTIN) 400 MG capsule Take 400 mg by mouth at bedtime.    [provider]  linezolid (ZYVOX) 600 MG tablet Take 1 tablet (600 mg total) by mouth every 12 (twelve) hours. Patient not taking: Reported on 09/13/2016 04/05/16   Sela Hilding, MD  loperamide (IMODIUM) 2 MG capsule Take 4 mg by mouth 3 (three) times daily.    [provider]  magnesium oxide (MAG-OX) 400 MG tablet Take 400 mg by mouth 2 (two) times daily.    [provider]  mirtazapine (REMERON) 15 MG tablet Take 15 mg by mouth at bedtime.    [provider]  Multiple Vitamin (MULTIVITAMIN) tablet Take 1 tablet by mouth daily.    [provider]  oxyCODONE (OXY IR/ROXICODONE) 5 MG immediate release tablet Take 5 mg by mouth every 4 (four) hours as needed for severe pain.    [provider]  sertraline (ZOLOFT) 25 MG tablet Take 25 mg by mouth daily. 07/08/16 07/08/17  [provider]  sodium bicarbonate 650 MG tablet Take 1,300 mg by mouth 2 (two) times daily.     [provider]  temazepam (RESTORIL) 30 MG capsule Take 30 mg by mouth at bedtime.    [provider]  vitamin B-12 (CYANOCOBALAMIN) 100 MCG tablet Take 100 mcg by mouth  daily.    [provider]    Family History No family history on file.  Social History Social History   Tobacco Use  . Smoking status: Never Smoker  . Smokeless tobacco: Never Used  Substance Use Topics  . Alcohol use: No    Frequency: Never  . Drug use: No   Resides in skilled nursing facility.  DO NOT RESUSCITATE CODE STATUS Allergies   Aspirin; Nsaids; Sulfa antibiotics; and Codeine   Review of Systems Review of Systems  Unable to perform ROS: Unstable vital signs  Allergic/Immunologic: Positive for immunocompromised state.     Physical Exam Updated Vital Signs BP (S) (!) 60/34 (BP Location: Right Arm)   Pulse (!) 110    Temp (S) (!) 97.2 F (36.2 C) (Rectal)   Resp 18   Physical Exam  Constitutional:  Acutely and chronically ill appearing  HENT:  Head: Normocephalic and atraumatic.  Membranes dry  Eyes: Pupils are equal, round, and reactive to light. Conjunctivae are normal.  Neck: Neck supple. No tracheal deviation present. No thyromegaly present.  Cardiovascular: Normal rate and regular rhythm.  No murmur heard. Pulmonary/Chest: Effort normal and breath sounds normal.  Abdominal: Soft. Bowel sounds are normal. She exhibits no distension. There is no tenderness.  Colostomy in place nephrostomy in place  Musculoskeletal: Normal range of motion. She exhibits no edema or tenderness.  Right lower extremity cool and mottled from mid thigh to foot.  Femoral DP and PT pulses absent.  Left lower extremity DP and PT pulses absent.  Femoral pulse 2+.  Bilateral upper extremities without redness swelling or tenderness neurovascular intact.  Neurological: She is alert. Coordination normal.  Follow simple commands.  Moves all extremities minimal volitional movement of right lower extremity.  Skin: Skin is warm and dry. Capillary refill takes more than 3 seconds. No rash noted.  There is a 2 cm superficial stage II decubitus ulcer presacral area.  Nursing note and vitals reviewed.    ED Treatments / Results  Labs (all labs ordered are listed, but only abnormal results are displayed) Labs Reviewed  COMPREHENSIVE METABOLIC PANEL  CBC  URINALYSIS, ROUTINE W REFLEX MICROSCOPIC  I-STAT TROPONIN, ED  I-STAT CHEM 8, ED  I-STAT CG4 LACTIC ACID, ED  TYPE AND SCREEN    EKG Interpretation  Date/Time:  Sunday August 03 2017 11:49:25 EDT Ventricular Rate:  93 PR Interval:    QRS Duration: 80 QT Interval:  424 QTC Calculation: 528 R Axis:   28 Text Interpretation:  Sinus rhythm Right atrial enlargement Abnormal R-wave progression, early transition ST elevation, consider inferior injury Prolonged QT interval No  significant change since last tracing Confirmed by Orlie Dakin [phone]) on 07/10/2017 12:37:52 PM      EKG None  Radiology No results found.  Procedures Procedures (including critical care time) Results for orders placed or performed during the hospital encounter of 07/04/2017  I-stat troponin, ED  Result Value Ref Range   Troponin i, poc 0.01 0.00 - 0.08 ng/mL   Comment 3          I-stat chem 8, ed  Result Value Ref Range   Sodium 123 (L) 135 - 145 mmol/L   Potassium 3.2 (L) 3.5 - 5.1 mmol/L   Chloride 91 (L) 101 - 111 mmol/L   BUN 132 (H) 6 - 20 mg/dL   Creatinine, Ser 13.30 (H) 0.44 - 1.00 mg/dL   Glucose, Bld 148 (H) 65 - 99 mg/dL   Calcium, Ion 1.00 (L) 1.15 -  1.40 mmol/L   TCO2 10 (L) 22 - 32 mmol/L   Hemoglobin 8.5 (L) 12.0 - 15.0 g/dL   HCT 25.0 (L) 36.0 - 46.0 %  I-Stat CG4 Lactic Acid, ED  Result Value Ref Range   Lactic Acid, Venous 3.47 (HH) 0.5 - 1.9 mmol/L   Comment NOTIFIED PHYSICIAN    No results found. Medications Ordered in ED Medications  sodium chloride 0.9 % bolus 1,000 mL (has no administration in time range)    Consulted Dr. Donzetta Matters telephone who suggested proper studies  to determine with runoff to legs.'s circulatory status of right lower extremity is CT scan of chest abdomen pelvis ordered by me.  I subsequently contacted patient's healthcare power of attorney and daughter Rhonda Price telephone who states the patient is comfort measures only.  Hospice was to consult on her this week.  No diagnostic studies to be performed.  CT scan and other diagnostic studies canceled. Results for orders placed or performed during the hospital encounter of 07/28/2017  Comprehensive metabolic panel  Result Value Ref Range   Sodium 122 (L) 135 - 145 mmol/L   Potassium 3.3 (L) 3.5 - 5.1 mmol/L   Chloride 87 (L) 101 - 111 mmol/L   CO2 8 (L) 22 - 32 mmol/L   Glucose, Bld 146 (H) 65 - 99 mg/dL   BUN 148 (H) 6 - 20 mg/dL   Creatinine, Ser 12.13 (H) 0.44 - 1.00 mg/dL     Calcium 8.3 (L) 8.9 - 10.3 mg/dL   Total Protein 7.8 6.5 - 8.1 g/dL   Albumin 2.6 (L) 3.5 - 5.0 g/dL   AST 37 15 - 41 U/L   ALT 48 14 - 54 U/L   Alkaline Phosphatase 222 (H) 38 - 126 U/L   Total Bilirubin 1.2 0.3 - 1.2 mg/dL   GFR calc non Af Amer 3 (L) >60 mL/min   GFR calc Af Amer 3 (L) >60 mL/min   Anion gap 27 (H) 5 - 15  CBC  Result Value Ref Range   WBC 24.9 (H) 4.0 - 10.5 K/uL   RBC 2.88 (L) 3.87 - 5.11 MIL/uL   Hemoglobin 8.6 (L) 12.0 - 15.0 g/dL   HCT 24.8 (L) 36.0 - 46.0 %   MCV 86.1 78.0 - 100.0 fL   MCH 29.9 26.0 - 34.0 pg   MCHC 34.7 30.0 - 36.0 g/dL   RDW 13.2 11.5 - 15.5 %   Platelets 296 150 - 400 K/uL  I-stat troponin, ED  Result Value Ref Range   Troponin i, poc 0.01 0.00 - 0.08 ng/mL   Comment 3          I-stat chem 8, ed  Result Value Ref Range   Sodium 123 (L) 135 - 145 mmol/L   Potassium 3.2 (L) 3.5 - 5.1 mmol/L   Chloride 91 (L) 101 - 111 mmol/L   BUN 132 (H) 6 - 20 mg/dL   Creatinine, Ser 13.30 (H) 0.44 - 1.00 mg/dL   Glucose, Bld 148 (H) 65 - 99 mg/dL   Calcium, Ion 1.00 (L) 1.15 - 1.40 mmol/L   TCO2 10 (L) 22 - 32 mmol/L   Hemoglobin 8.5 (L) 12.0 - 15.0 g/dL   HCT 25.0 (L) 36.0 - 46.0 %  I-Stat CG4 Lactic Acid, ED  Result Value Ref Range   Lactic Acid, Venous 3.47 (HH) 0.5 - 1.9 mmol/L   Comment NOTIFIED PHYSICIAN    Dg Chest Port 1 View  Result Date: 08/02/2017 CLINICAL DATA:  hypotension. EXAM: PORTABLE CHEST 1 VIEW COMPARISON:  04/03/2017 FINDINGS: Left Port-A-Cath terminates at the low SVC. Osteopenia. Midline trachea. Normal heart size. No pleural effusion or pneumothorax. Mild bibasilar airspace disease. IMPRESSION: Mild bibasilar airspace disease which could represent atelectasis or early infection. Consider short term radiographic follow-up if symptoms persist. Electronically Signed   By: Abigail Miyamoto M.D.   On: 07/22/2017 12:38   Initial Impression / Assessment and Plan / ED Course  I have reviewed the triage vital signs and the  nursing notes.  Pertinent labs & imaging results that were available during my care of the patient were reviewed by me and considered in my medical decision making (see chart for details).     1:30 PM after treatment with intravenous fluids patient is more alert appears more comfortable.  Right lower extremity is less mottled.  Patient's daughter and power of attorney arrived.  She agrees with overnight stay in the hospital and palliative care consult. Work is consistent with acute renal failure , Hyponatremia and hypokalemia, anemia and profound light leukocytosis.  I consulted Dr. Aggie Moats who will arrange for overnight stay.  Palliative care consult ordered by me Final Clinical Impressions(s) / ED Diagnoses  Diagnoses #1 undifferentiated shock #2 acute renal failure #3 vascular insufficiency of right leg #4 hyponatremia #5 hypokalemia #6 anemia #7 presacral decubitus ulcer CRITICAL CARE Performed by: Orlie Dakin Total critical care time: 60 minutes Critical care time was exclusive of separately billable procedures and treating other patients. Critical care was necessary to treat or prevent imminent or life-threatening deterioration. Critical care was time spent personally by me on the following activities: development of treatment plan with patient and/or surrogate as well as nursing, discussions with consultants, evaluation of patient's response to treatment, examination of patient, obtaining history from patient or surrogate, ordering and performing treatments and interventions, ordering and review of laboratory studies, ordering and review of radiographic studies, pulse oximetry and re-evaluation of patient's condition. Final diagnoses:  None    ED Discharge Orders    None       Orlie Dakin, MD 07/31/2017 1420

## 2017-08-03 NOTE — Progress Notes (Signed)
Patient has urostomy that is draining. No need for foley or bladder scan

## 2017-08-03 NOTE — ED Notes (Signed)
Pt having difficulty swallowing, chewed pill and let it dissolve- will request liquid pain med.

## 2017-08-03 NOTE — Care Management (Signed)
ED CM met with patient and daughter Marina Gravel [phone] at bedside. She reports her mother was transferred from [facility] to [facility] ED but patient was supposed to be assessed by [facility] services for home hospice services. Patient and daughter are desiring comfort care with home hospice.  CM discussed with EDP Dr. Winfred Leeds  Hospice referral made. CM contacted [facility] RN liaison Olivia Mackie noted the referral. A hospice admission nurse could come out for an assessment tomorrow. Patient and daughter made aware, patient and daughter not comfortable with taking patient home without hospice service arranged would prefer to stay in the hospital until services have been arranged. Updated the EDP Dr. Winfred Leeds.

## 2017-08-03 NOTE — ED Notes (Signed)
Dr. Hobbs at bedside  

## 2017-08-03 NOTE — ED Notes (Signed)
Daughter at bedside.

## 2017-08-03 NOTE — H&P (Addendum)
[facility] History and Physical  Shashana Fullington VHQ:[id number] DOB: Jul 29, 1950 DOA: 07/05/2017  Referring physician:  PCP: Thurman Coyer, MD   Chief Complaint: Cyanosis  HPI: Rhonda Price is a 67 y.o. female significant for cancer presents to the emergency room with cyanosis.  Staff at patient's nursing home found her blue and hypoxic.  They activated EMS.  EMS came patient was also found to be hypotensive with blood pressure systolic in the 41L and 24M.  Started on fluids.  Started on oxygen.  Given a breathing treatment in route via EMS to the emergency room for evaluation.  ED course: In the emergency room patient was given IV fluids.  She was found to have an ischemia in her right lower extremity.  Unable to pickup blood flow by bedside doppler.  After the surgery was consulted and they recommended imaging.  Blood creatinine was found to be 13.  EDP contacted patient's daughter/POA who stated patient was to be comfort care.  Hospitalist consulted for admission.   Review of Systems:  As per HPI otherwise 10 point review of systems negative.    Past Medical History:  Diagnosis Date  . Cancer ([place])   . Sepsis [facility])    Past Surgical History:  Procedure Laterality Date  . CHOLECYSTECTOMY    . IR GENERIC HISTORICAL  04/01/2016   IR NEPHROSTOMY TUBE CHANGE 04/01/2016 Corrie Mckusick, DO [facility]-INTERV RAD   Social History:  reports that she has never smoked. She has never used smokeless tobacco. She reports that she does not drink alcohol or use drugs.  Allergies  Allergen Reactions  . Aspirin Anaphylaxis  . Nsaids Anaphylaxis  . Sulfa Antibiotics Anaphylaxis  . Codeine Hives    No family history on file.   Prior to Admission medications   Medication Sig Start Date End Date Taking? Authorizing Provider  cholecalciferol (VITAMIN D) 400 units TABS tablet Take 400 Units by mouth daily.    [provider]  collagenase (SANTYL) ointment Apply 1 application  topically daily.    [provider]  diphenoxylate-atropine (LOMOTIL) 2.5-0.025 MG tablet Take 1 tablet by mouth 4 (four) times daily as needed for diarrhea or loose stools.    [provider]  ferrous sulfate 325 (65 FE) MG tablet Take 325 mg by mouth daily with breakfast.    [provider]  gabapentin (NEURONTIN) 100 MG capsule Take 200 mg by mouth every morning.     [provider]  gabapentin (NEURONTIN) 400 MG capsule Take 400 mg by mouth at bedtime.    [provider]  linezolid (ZYVOX) 600 MG tablet Take 1 tablet (600 mg total) by mouth every 12 (twelve) hours. Patient not taking: Reported on 09/13/2016 04/05/16   Sela Hilding, MD  loperamide (IMODIUM) 2 MG capsule Take 4 mg by mouth 3 (three) times daily.    [provider]  magnesium oxide (MAG-OX) 400 MG tablet Take 400 mg by mouth 2 (two) times daily.    [provider]  mirtazapine (REMERON) 15 MG tablet Take 15 mg by mouth at bedtime.    [provider]  Multiple Vitamin (MULTIVITAMIN) tablet Take 1 tablet by mouth daily.    [provider]  oxyCODONE (OXY IR/ROXICODONE) 5 MG immediate release tablet Take 5 mg by mouth every 4 (four) hours as needed for severe pain.    [provider]  sertraline (ZOLOFT) 25 MG tablet Take 25 mg by mouth daily. 07/08/16 07/08/17  [provider]  sodium bicarbonate  650 MG tablet Take 1,300 mg by mouth 2 (two) times daily.     [provider]  temazepam (RESTORIL) 30 MG capsule Take 30 mg by mouth at bedtime.    [provider]  vitamin B-12 (CYANOCOBALAMIN) 100 MCG tablet Take 100 mcg by mouth daily.    [provider]   Physical Exam: Vitals:   07/30/2017 1145 07/18/2017 1200 07/15/2017 1215 07/11/2017 1220  BP: (!) 56/39 (!) 58/37 (!) 81/49 (!) 77/61  Pulse:   90 88  Resp: (!) 22 19 15 18   Temp:      TempSrc:      SpO2:   (!) 83% 93%    Wt Readings from Last 3 Encounters:    04/03/17 51.3 kg (113 lb)  09/13/16 51.3 kg (113 lb)  03/31/16 51.3 kg (113 lb)    General:  Appears calm and comfortable, Alert, speaks in in audible whisper; follows commands Eyes:  PERRL, EOMI, normal lids, iris ENT:  grossly normal hearing, lips & tongue Neck:  no LAD, masses or thyromegaly Cardiovascular:  RRR, no m/r/g. No LE edema. Ischemic RLE, blue toes Respiratory:  CTA bilaterally, no w/r/r. Normal respiratory effort. Abdomen:  soft, ntnd Skin:  no rash or induration seen on limited exam Musculoskeletal:  grossly normal tone BUE Psychiatric: unable to assess Neurologic:  CN 2-12 grossly intact, moves upper extremities in coordinated fashion.          Labs on Admission:  Basic Metabolic Panel: Recent Labs  Lab 07/24/2017 1221  NA 123*  K 3.2*  CL 91*  GLUCOSE 148*  BUN 132*  CREATININE 13.30*   Liver Function Tests: No results for input(s): AST, ALT, ALKPHOS, BILITOT, PROT, ALBUMIN in the last 168 hours. No results for input(s): LIPASE, AMYLASE in the last 168 hours. No results for input(s): AMMONIA in the last 168 hours. CBC: Recent Labs  Lab 08/02/2017 1221  HGB 8.5*  HCT 25.0*   Cardiac Enzymes: No results for input(s): CKTOTAL, CKMB, CKMBINDEX, TROPONINI in the last 168 hours.  BNP (last 3 results) No results for input(s): BNP in the last 8760 hours.  ProBNP (last 3 results) No results for input(s): PROBNP in the last 8760 hours.   Creatinine clearance cannot be calculated (Unknown ideal weight.)  CBG: No results for input(s): GLUCAP in the last 168 hours.  Radiological Exams on Admission: Dg Chest Port 1 View  Result Date: 08/02/2017 CLINICAL DATA:  hypotension. EXAM: PORTABLE CHEST 1 VIEW COMPARISON:  04/03/2017 FINDINGS: Left Port-A-Cath terminates at the low SVC. Osteopenia. Midline trachea. Normal heart size. No pleural effusion or pneumothorax. Mild bibasilar airspace disease. IMPRESSION: Mild bibasilar airspace disease which could  represent atelectasis or early infection. Consider short term radiographic follow-up if symptoms persist. Electronically Signed   By: Abigail Miyamoto M.D.   On: 07/17/2017 12:38    EKG: Independently reviewed. NSR, long QT, no stemi  Assessment/Plan Active Problems:   End of life care   End of Life care Confirmed with her daughter that she wanted no interventions taken and the patient was to be palliative EDP has Artie consulted palliative care PRN albuterol for shortness of breath PRN Ativan for seizures As needed Zofran for nausea PRN liquid tears for dry eye PRN senna for constipation  Code Status: DNR  DVT Prophylaxis: none Family Communication: called dgtr Disposition Plan: Pending Improvement  Status: medsurg, inpt  Elwin Mocha, MD Family Medicine [facility] [email or web address] Password [email or web address]

## 2017-08-04 DIAGNOSIS — N179 Acute kidney failure, unspecified: Secondary | ICD-10-CM

## 2017-08-04 DIAGNOSIS — J189 Pneumonia, unspecified organism: Secondary | ICD-10-CM

## 2017-08-04 DIAGNOSIS — J9601 Acute respiratory failure with hypoxia: Secondary | ICD-10-CM

## 2017-08-04 DIAGNOSIS — L8990 Pressure ulcer of unspecified site, unspecified stage: Secondary | ICD-10-CM

## 2017-08-04 DIAGNOSIS — A419 Sepsis, unspecified organism: Principal | ICD-10-CM

## 2017-08-04 DIAGNOSIS — I998 Other disorder of circulatory system: Secondary | ICD-10-CM

## 2017-08-04 DIAGNOSIS — I70229 Atherosclerosis of native arteries of extremities with rest pain, unspecified extremity: Secondary | ICD-10-CM

## 2017-08-04 DIAGNOSIS — R6521 Severe sepsis with septic shock: Secondary | ICD-10-CM

## 2017-08-04 LAB — MRSA PCR SCREENING: MRSA by PCR: POSITIVE — AB

## 2017-08-04 MED ORDER — LORAZEPAM 2 MG/ML IJ SOLN
1.0000 mg | INTRAMUSCULAR | Status: DC | PRN
  Administered 2017-08-04: 1 mg via INTRAVENOUS

## 2017-08-04 MED ORDER — GLYCOPYRROLATE 1 MG PO TABS
1.0000 mg | ORAL_TABLET | ORAL | Status: DC | PRN
Start: 2017-08-04 — End: 2017-08-04
  Filled 2017-08-04: qty 1

## 2017-08-04 MED ORDER — GLYCOPYRROLATE 0.2 MG/ML IJ SOLN: 0.2000 mg | INTRAMUSCULAR | Status: DC | PRN

## 2017-08-04 MED ORDER — MORPHINE SULFATE (PF) 4 MG/ML IV SOLN
4.0000 mg | INTRAVENOUS | Status: DC | PRN
  Administered 2017-08-04: 6 mg via INTRAVENOUS

## 2017-08-04 MED ORDER — MORPHINE SULFATE (PF) 4 MG/ML IV SOLN
INTRAVENOUS | Status: AC
  Administered 2017-08-04: 4 mg
  Filled 2017-08-04: qty 1

## 2017-08-04 MED ORDER — WHITE PETROLATUM EX OINT
TOPICAL_OINTMENT | CUTANEOUS | Status: AC
  Administered 2017-08-04: 0.2
  Filled 2017-08-04: qty 28.35

## 2017-08-04 MED ORDER — MORPHINE 100MG IN NS 100ML (1MG/ML) PREMIX INFUSION
1.0000 mg/h | INTRAVENOUS | Status: DC
  Administered 2017-08-04: 1 mg/h via INTRAVENOUS
  Filled 2017-08-04 (×2): qty 100

## 2017-08-04 MED ORDER — MORPHINE SULFATE (PF) 4 MG/ML IV SOLN
2.0000 mg | INTRAVENOUS | Status: DC | PRN
  Administered 2017-08-04: 4 mg via INTRAVENOUS
  Filled 2017-08-04: qty 1

## 2017-08-04 MED ORDER — GLYCOPYRROLATE 0.2 MG/ML IJ SOLN
0.2000 mg | INTRAMUSCULAR | Status: DC | PRN
  Administered 2017-08-04: 0.2 mg via INTRAVENOUS
  Filled 2017-08-04: qty 1

## 2017-08-04 DEATH — deceased

## 2017-09-03 NOTE — Progress Notes (Addendum)
[facility] Liaison RN visit  Met with Daughter Claudette Head to confirm interest in home with hospice after discharge.  Daughter expressed concerns about patient recent  rapid decline and being able to meet her needs at home.  Checked in with bedside RN who stated patient has noted audible congestion, labored breaths and increased morphine needs over weekend. Per discussion with daughter and CMRN, may need to reassess if patient appropriate for home with hospice at this time.   [facility] made aware of the above.  Please contact [facility] for concerns or hospice questions.  Thank you,  Gar Ponto, [facility] Liaison  [place] now found on AMION

## 2017-09-03 NOTE — Progress Notes (Signed)
PROGRESS NOTE  Rhonda Price DDU:[id number] DOB: [birth year] DOA: 07/16/2017 PCP: Thurman Coyer, MD  Brief Narrative: 67 year old woman PMH cervical cancer, remote, colostomy, urostomy, presented to the emergency department with profound hypoxia, hypotension, cyanosis.  Found to have critical right lower extremity ischemia, acute kidney injury.  Admitted for the same, sepsis, acute respiratory failure.  In discussion with daughter/power of attorney, patient made full comfort care with plans for home with hospice if patient stabilizes.  Assessment/Plan Suspected septic shock secondary to bilateral pneumonia  --Supportive care based on goals of care.  Critical limb ischemia right lower extremity --No further evaluation, surgery, imaging or curative treatment per POA.  Acute kidney injury --No further treatment based on goals of care.  Acute hypoxic respiratory failure --Supportive care.  Possible STEMI --EKG is equivocal.  Regardless, supportive care, no aggressive care per POA.  Stage II decubitus ulcer presacral area, present on admission --No further evaluation suggested.  PMH cervical cancer with XRT, remote.  Status post colostomy and urostomy.   Discussed with daughter at bedside, the patient appears to be dying, prognosis at this point may be less than 2-3 days.  Appears to be in distress, restless, and severe pain.  Will change to IV morphine, utilize sublingual for moderate pain, if unable to achieve pain control with IV push, will start IV morphine infusion.  Daughter again confirms goals are full comfort at home with hospice if possible.  Is not clear the patient will survive to discharge.  DVT prophylaxis: Not indicated based on goals of care Code Status: DNR/DNI Family Communication:  Disposition Plan: home with hospice     Murray Hodgkins, MD  [facility] Direct contact: [phone] --[place]ia [place]  --[email or web address]; password [email or web address]  7PM-7AM contact  night coverage as above 08/15/17, 11:54 AM  LOS: 1 day   Consultants:    Procedures:    Antimicrobials:    Interval history/Subjective: Restless, pain in right leg, pain poorly controlled.  Daughter at bedside.  Objective: Vitals:  Vitals:   07/25/2017 1843 Aug 15, 2017 0452  BP:  (!) 67/42  Pulse:  92  Resp: 16 16  Temp:  97.7 F (36.5 C)  SpO2:  (!) 85%    Exam:  Constitutional:  . Appears critically ill, in distress from pain, restless. Eyes:  . pupils and irises appear normal Respiratory:  . CTA bilaterally, no w/r/r.  . Respiratory effort moderately increased. Cardiovascular:  . Tachycardic, regular rhythm, no m/r/g . No LE extremity edema   . Unable to appreciate DP pulses.  The right leg is mottled from the thigh to the foot. Abdomen:  . Colostomy and urostomy noted. Musculoskeletal:  . RLE . Skin is mottled from the thigh to the foot. Psychiatric:  . Mental status o Mood, affect unable to assess, appears to be in distress. . judgement and insight impaired.   I have personally reviewed the following:   Labs:  On admission: Sodium 123, BUN 132, creatinine 13.3, lactic acid 3.47, hemoglobin 8.6, WBC 24.9.  Imaging studies:  Chest x-ray mild bibasilar airspace disease, atelectasis or early infection.  I have independently reviewed this x-ray and agree with the interpretation, to my eye appears to be bilateral infiltrates.  Medical tests:  EKG sinus rhythm, cannot exclude inferior injury   Scheduled Meds: Continuous Infusions: . sodium chloride      Active Problems:   Pressure injury of skin   End of life care   Septic shock ([facility])   Bilateral pneumonia   Critical  lower limb ischemia   AKI (acute kidney injury) ([place])   Acute hypoxemic respiratory failure ([place])   LOS: 1 day

## 2017-09-03 NOTE — Progress Notes (Signed)
Wasting 97 ml of morphine Witnessed by Leanord Hawking

## 2017-09-03 NOTE — Care Management Note (Signed)
Case Management Note  Patient Details  Name: [name] MRN: [id number] Date of Birth: [birth year]  Subjective/Objective:                    Action/Plan:  Spoke to patient's daughter Dierdre Searles ( cell [phone])at bedside. Claudette Head wants to take her mother home with hospice . Choice offered . Claudette Head wants [facility].   Confirmed face sheet information with Teressa. Patient already has hospital bed, walker and wheelchair at home. Patient currently on oxygen. Patient does not have oxygen. Claudette Head requesting ambulance transport home at time of discharge. tressa aware discharge mat be today.   Referral given to [facility] with [facility].  Expected Discharge Date:                  Expected Discharge Plan:  Home w Hospice Care  In-House Referral:     Discharge planning Services  CM Consult  Post Acute Care Choice:  Durable Medical Equipment Choice offered to:  Adult Children, Patient  DME Arranged:  Oxygen DME Agency:     HH Arranged:  NA HH Agency:  [facility]  Status of Service:  In process, will continue to follow  If discussed at Long Length of Stay Meetings, dates discussed:    Additional Comments:  Marilu Favre, RN 20-Aug-2017, 10:19 AM

## 2017-09-03 NOTE — Progress Notes (Signed)
Chaplain was called to Pt.'s room for support of daughter and and [facility] to Rhonda Price.  Patient is not receiving comfort care... At bed side was patient's attentive daughter holding Rhonda Price hand and stroking her forehead and hair.  Chaplain offered words of comfort, scripture and prayer. A member of the [place] visited with the patient a short while before my arrival. Daughter is teary, the is an expected difficult time.

## 2017-09-03 NOTE — Progress Notes (Signed)
   09/01/17 1300  Attending [place]  Attending Physician Notified Y  Attending Physician Name Dr. Sarajane Jews  Will the above attending physician sign death certificate?  (non ED physician) Yes  Post Mortem Checklist  Date of Death 09/01/2017  Time of Death 64  Pronounced By Lendell Caprice and Rutherford Nail  Next of kin notified Yes  Name of next of kin notified of death [place] Peers  Contact Person's Relationship to Patient Daughter  Contact Person's Phone Number [phone]  Was the patient a No Code Blue or a Limited Code Blue? Yes  Did the patient die unattended? No  Patient restrained? Not applicable  Weight 75.4 kg (112 lb 14 oz)  [facility]  Notification Date 09-01-2017  Notification Time [place] [facility] Number [phone]  Is patient a potential donor? N  Autopsy  Autopsy requested by N/A  Patient Belongings/Medications Returned  Patient belongings from bedside/safe/pharmacy returned  None  Dead on Arrival (Emergency Department)  Patient dead on arrival? No  Medical Examiner  Is this a medical examiner's case? [place] name[place]address/phone # [place] [phone]  Planned location of pickup [place]

## 2017-09-03 NOTE — Death Summary Note (Signed)
Death Summary  Rhonda Price MWN:[id number] DOB: 1951/03/17 DOA: 08-Aug-2017  PCP: Thurman Coyer, MD   Admit date: August 08, 2017 Date of Death: 08/09/2017  Final Diagnoses:  1. Septic shock secondary to bilateral pneumonia 2. Critical limb ischemia right lower extremity 3. Acute kidney injury 4. Acute hypoxic respiratory failure 5. Possible STEMI 6. Stage II decubitus ulcer presacral area, present on admission 7. Past medical history cervical cancer, remote  History of present illness:  67 year old woman PMH cervical cancer, remote, colostomy, urostomy, presented to the emergency department with profound hypoxia, hypotension, cyanosis.  Found to have critical right lower extremity ischemia, acute kidney injury.  Admitted for the same, sepsis, acute respiratory failure.   Hospital Course:  At the time of presentation, she was found to have critical limb ischemia, however daughter did not wish to pursue any further intervention or testing.  Daughter/healthcare power of attorney decided to pursue full comfort measures in conference with attending physician.  The patient was admitted to medical floor for supportive care and comfort measures.  Her condition rapidly declined and she subsequently died August 09, 2017.   The results of significant diagnostics from this hospitalization (including imaging, microbiology, ancillary and laboratory) are listed below for reference.    Significant Diagnostic Studies: Dg Chest Port 1 View  Result Date: 08-08-17 CLINICAL DATA:  hypotension. EXAM: PORTABLE CHEST 1 VIEW COMPARISON:  04/03/2017 FINDINGS: Left Port-A-Cath terminates at the low SVC. Osteopenia. Midline trachea. Normal heart size. No pleural effusion or pneumothorax. Mild bibasilar airspace disease. IMPRESSION: Mild bibasilar airspace disease which could represent atelectasis or early infection. Consider short term radiographic follow-up if symptoms persist. Electronically Signed   By: Abigail Miyamoto  M.D.   On: 08-08-17 12:38    Microbiology: Recent Results (from the past 240 hour(s))  MRSA PCR Screening     Status: Abnormal   Collection Time: 2017/08/09  5:57 AM  Result Value Ref Range Status   MRSA by PCR POSITIVE (A) NEGATIVE Final    Comment:        The GeneXpert MRSA Assay (FDA approved for NASAL specimens only), is one component of a comprehensive MRSA colonization surveillance program. It is not intended to diagnose MRSA infection nor to guide or monitor treatment for MRSA infections. RESULT CALLED TO, READ BACK BY AND VERIFIED WITH: K.BECKNER RN AT 0800 08-09-17 BY A.DAVIS Performed at [facility], [address] [place], [place] [address]      Labs: Basic Metabolic Panel: Recent Labs  Lab 08-Aug-2017 1210 Aug 08, 2017 1221  NA 122* 123*  K 3.3* 3.2*  CL 87* 91*  CO2 8*  --   GLUCOSE 146* 148*  BUN 148* 132*  CREATININE 12.13* 13.30*  CALCIUM 8.3*  --    Liver Function Tests: Recent Labs  Lab 2017-08-08 1210  AST 37  ALT 48  ALKPHOS 222*  BILITOT 1.2  PROT 7.8  ALBUMIN 2.6*   CBC: Recent Labs  Lab 08-08-2017 1210 08-08-17 1221  WBC 24.9*  --   HGB 8.6* 8.5*  HCT 24.8* 25.0*  MCV 86.1  --   PLT 296  --     Active Problems:   Pressure injury of skin   End of life care   Septic shock ([place])   Bilateral pneumonia   Critical lower limb ischemia   AKI (acute kidney injury) ([place])   Acute hypoxemic respiratory failure (H[facility])    Time: 35 minutes  Signed:  Murray Hodgkins, MD  [facility] 09-Aug-2017, 2:08 PM

## 2017-09-03 DEATH — deceased

## 2017-11-14 IMAGING — CR DG CHEST 2V
2 series · 2 of 2 positions shown · non-contrast
Comparison: None.

CLINICAL DATA: Patient with bloody urine. Concern for the
possibility of urinary tract infection.

EXAM:
CHEST  2 VIEW

[chest lat]
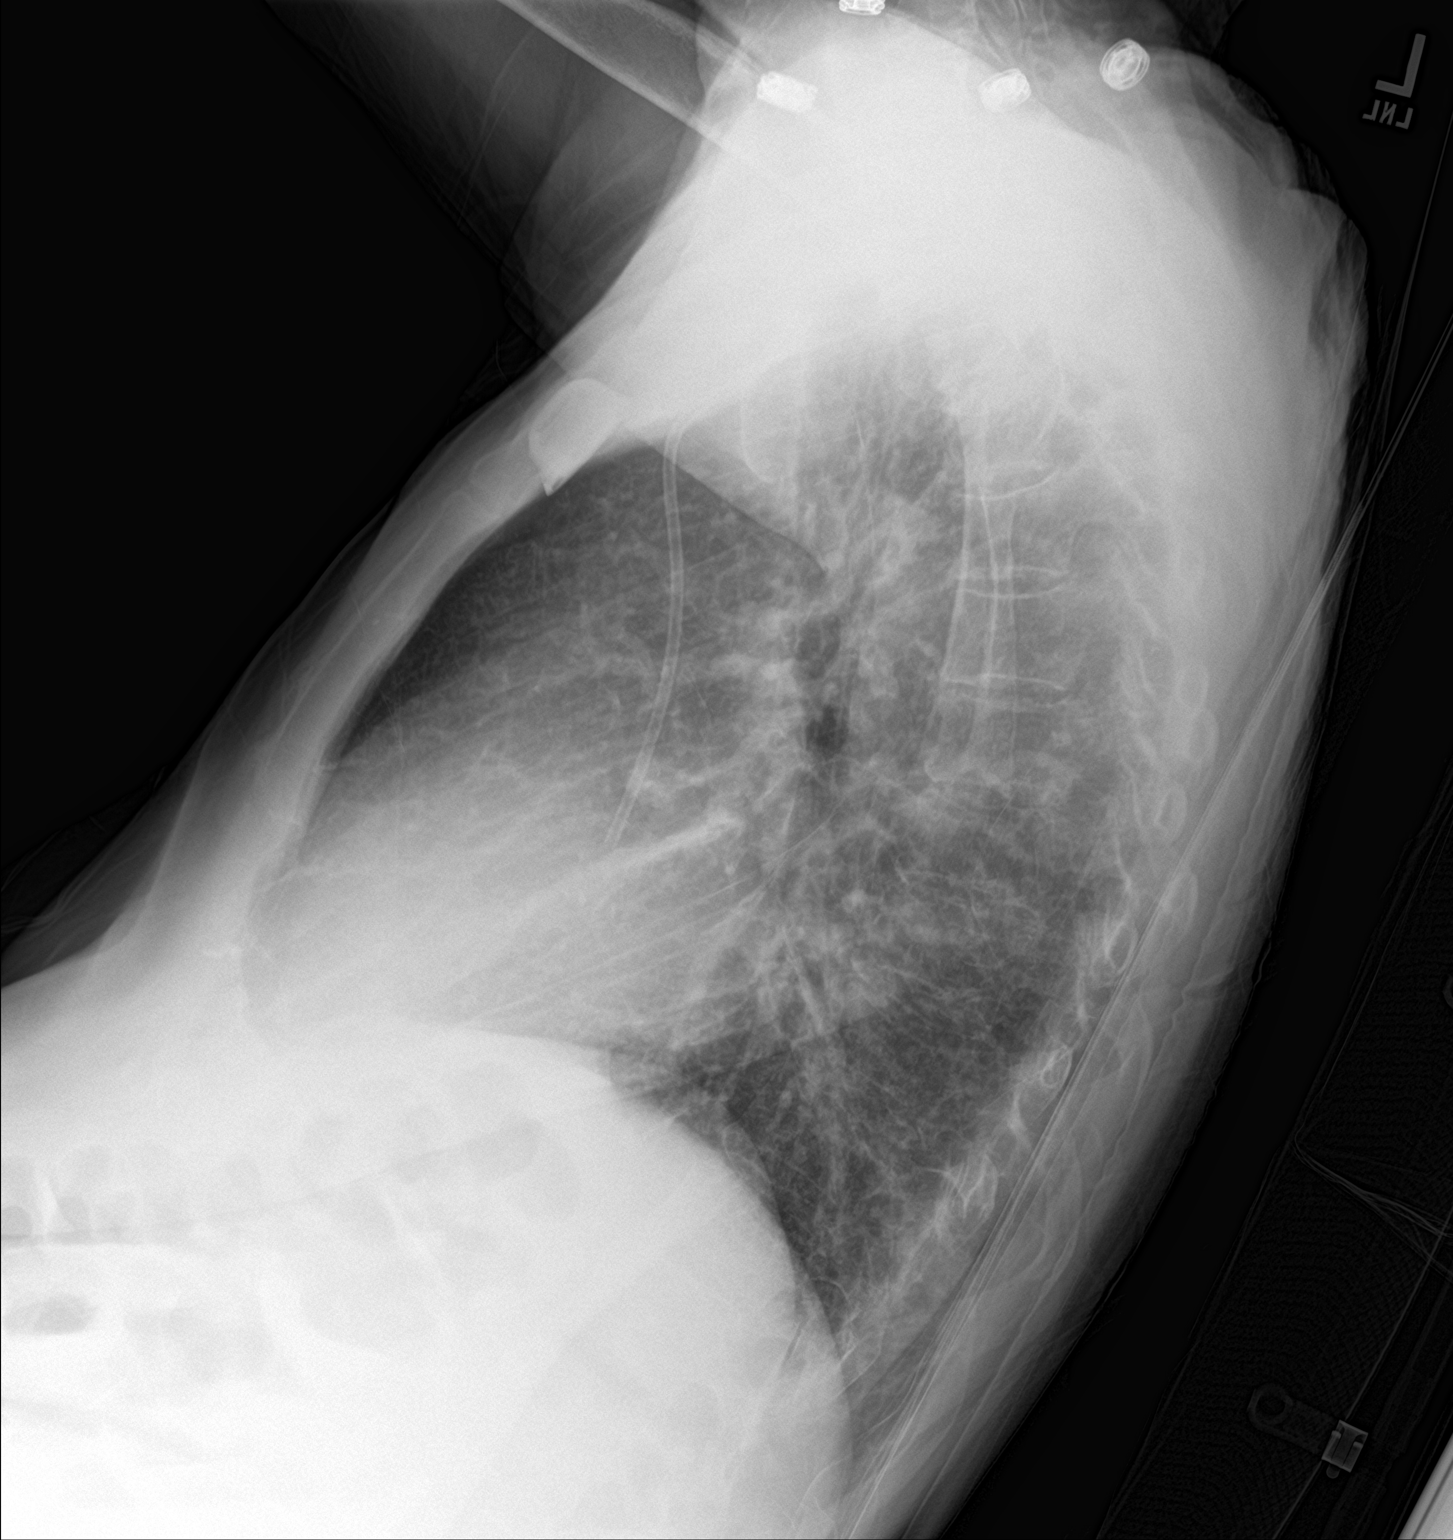

[chest ap]
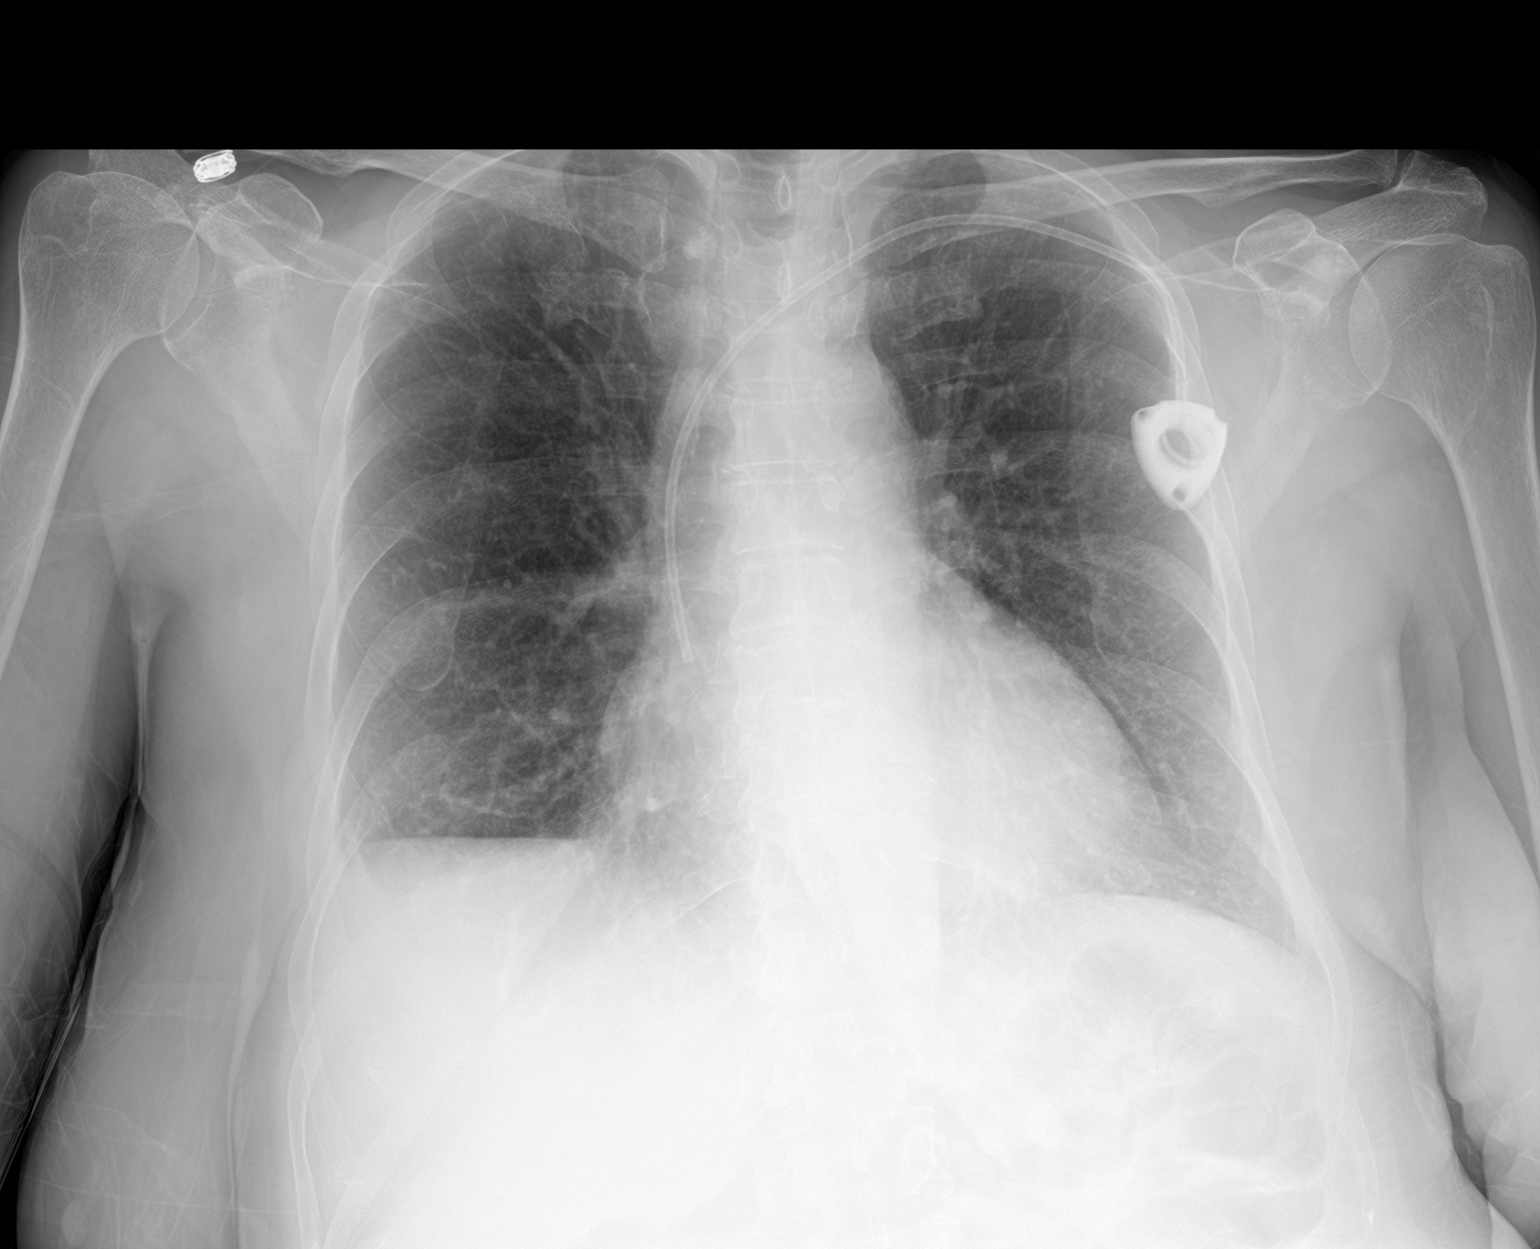

[2 of 2 positions shown; findings below may reference images not displayed]

FINDINGS: Port-A-Cath is present with tip projecting over the superior vena
cava. Cardiac contours upper limits of normal. No large area of
pulmonary consolidation. No pleural effusion or pneumothorax.
Thoracic spine degenerative changes.
IMPRESSION: No active cardiopulmonary disease.

## 2018-11-17 IMAGING — DX DG KNEE COMPLETE 4+V*R*
4 series · 4 of 4 positions shown · non-contrast
Comparison: None.

CLINICAL DATA: 66 y/o  F; fall with pain swelling.

EXAM:
RIGHT KNEE - COMPLETE 4+ VIEW

[knee ap]
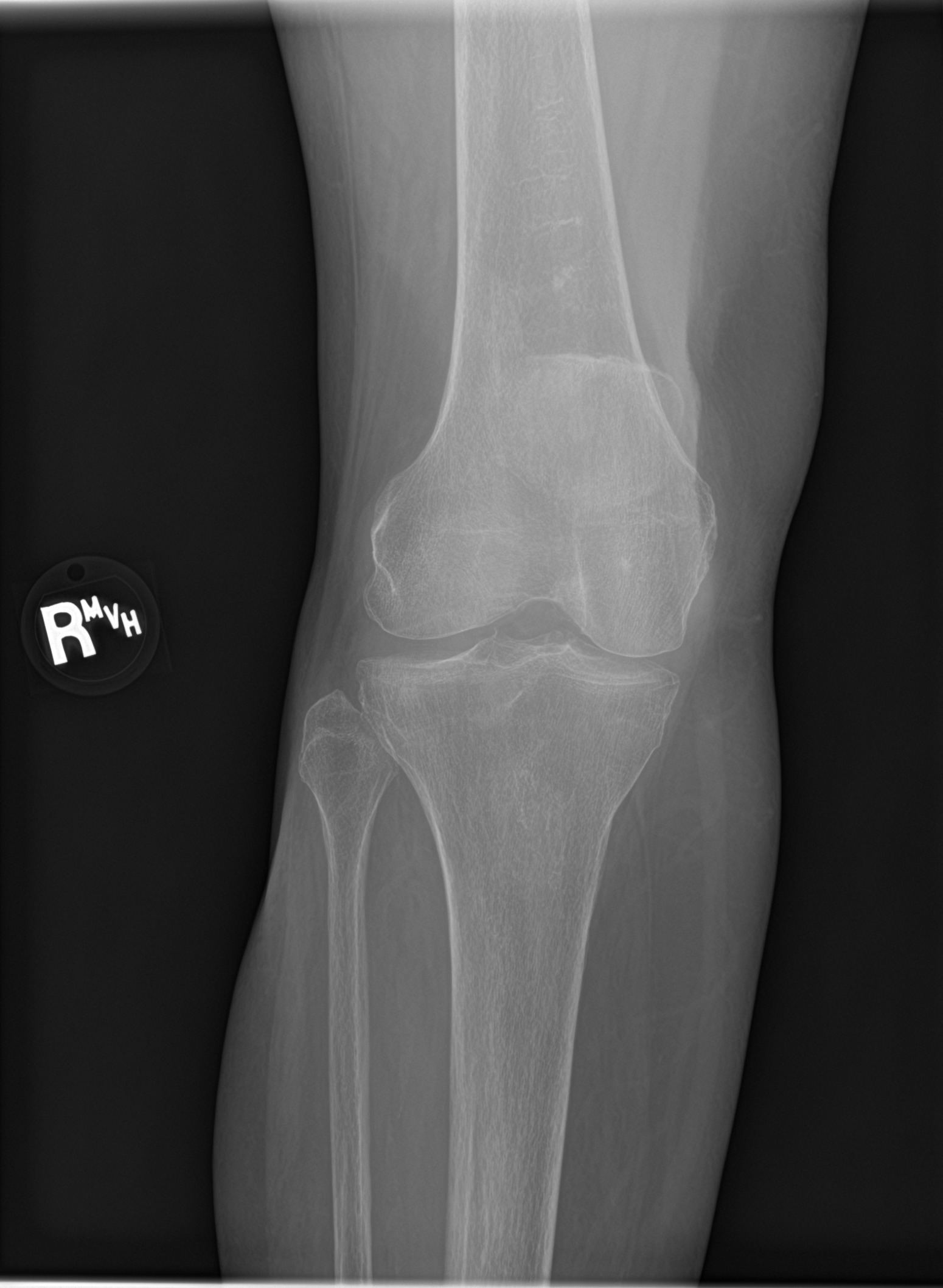

[knee lat]
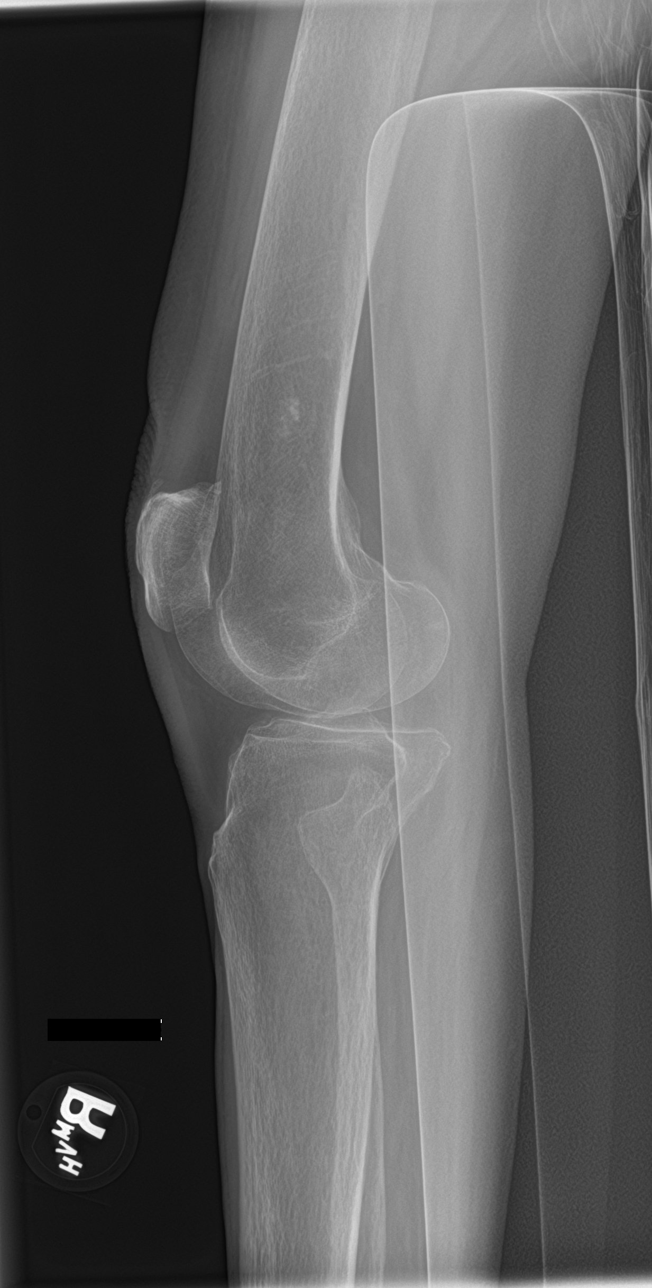

[knee obl (1 of 2)]
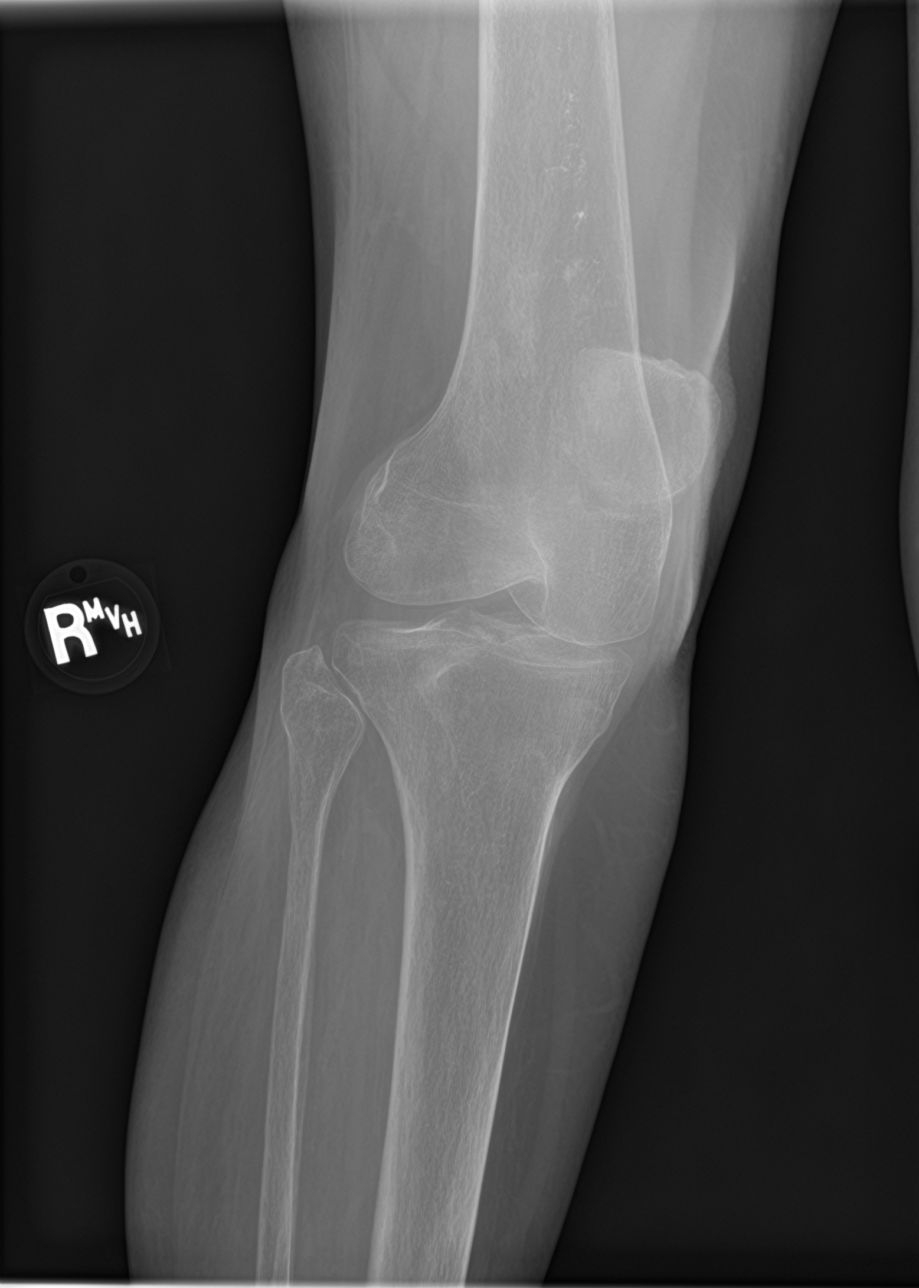

[knee obl (2 of 2)]
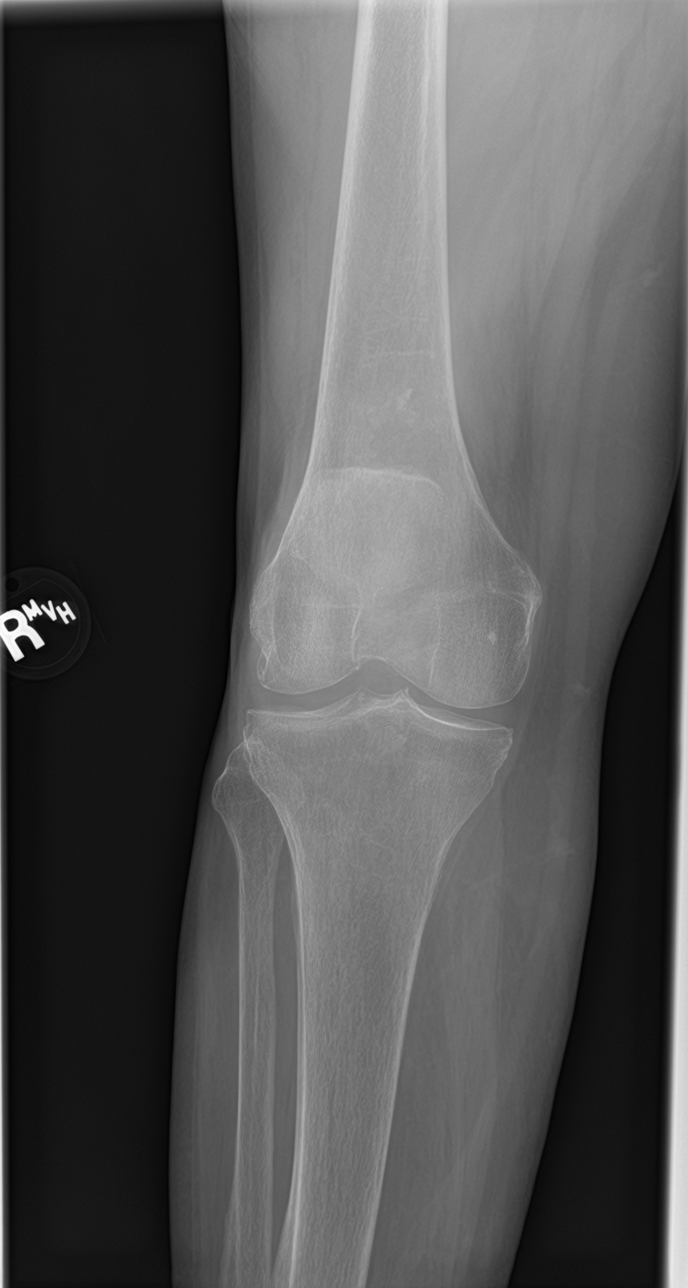

[4 of 4 positions shown; findings below may reference images not displayed]

FINDINGS: No evidence of fracture, dislocation, or joint effusion. Joint
spaces are maintained. Small tricompartmental periarticular
osteophytes and tibial spine spurring.
IMPRESSION: 1.  No acute fracture or dislocation identified.
2. Minimal tricompartmental osteoarthrosis.

By: Whitley Schafer M.D.

## 2018-11-17 IMAGING — CT CT KNEE*R* W/O CM
3 series · 14 of 33 positions shown, 17 images · non-contrast
Comparison: Plain film exam from earlier the same day

CLINICAL DATA: Fall with knee pain and swelling.

EXAM:
CT OF THE RIGHT KNEE WITHOUT CONTRAST
TECHNIQUE: Multidetector CT imaging of the right knee was performed according
to the standard protocol. Multiplanar CT image reconstructions were
also generated.

[Series 3: pelvis st · axial · 0.31mm/px · z∈[+712,+868]mm · 6 of 98 slices shown, 8 images]
[im 15/98  soft-tissue]
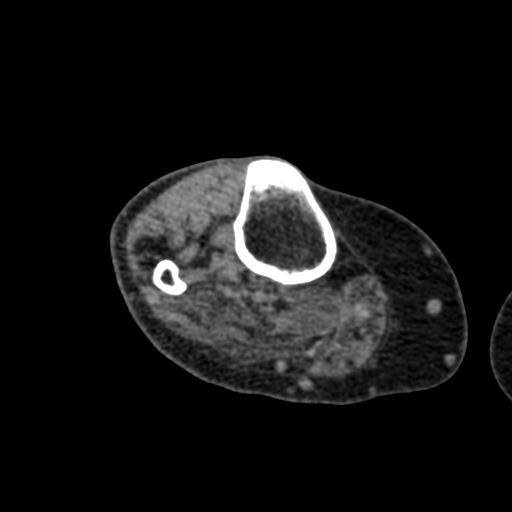
[im 15/98  bone]
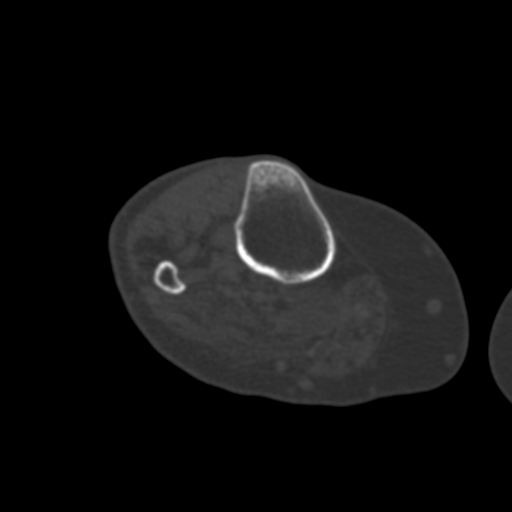
[im 30/98  bone]
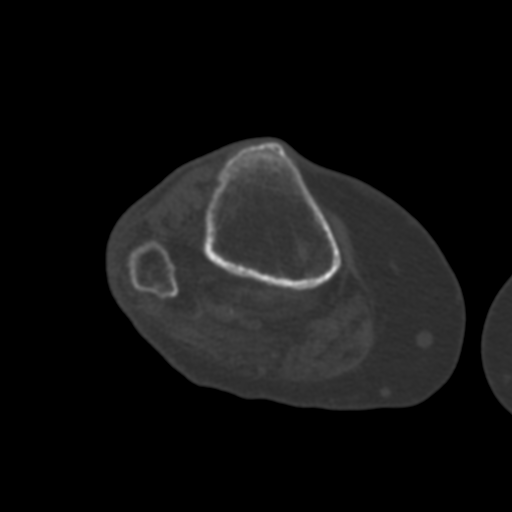
[im 45/98  bone]
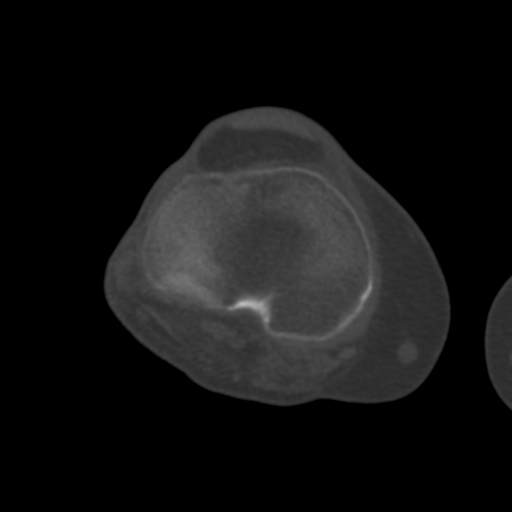
[im 60/98  bone]
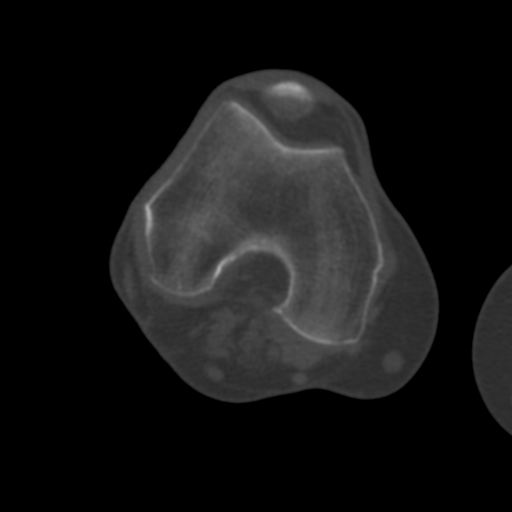
[im 75/98  soft-tissue]
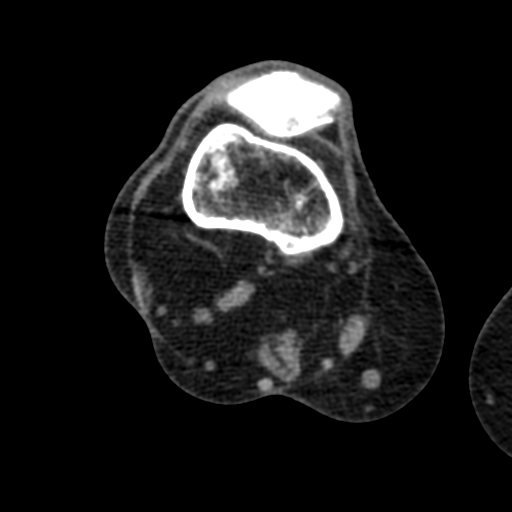
[im 75/98  bone]
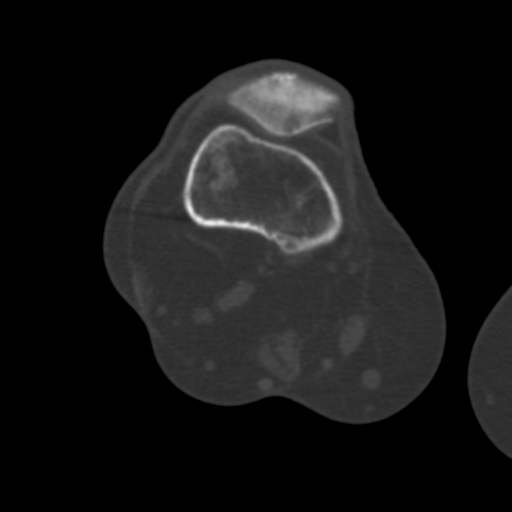
[im 90/98  bone]
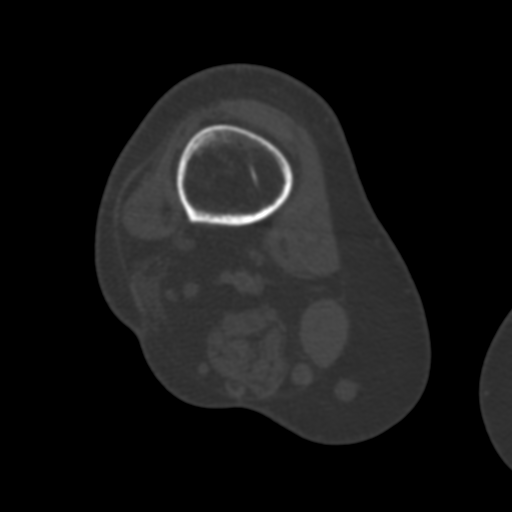

[Series 7: coronal images · coronal · 0.28mm/px · 3 of 68 slices shown]
[im 14/68  bone]
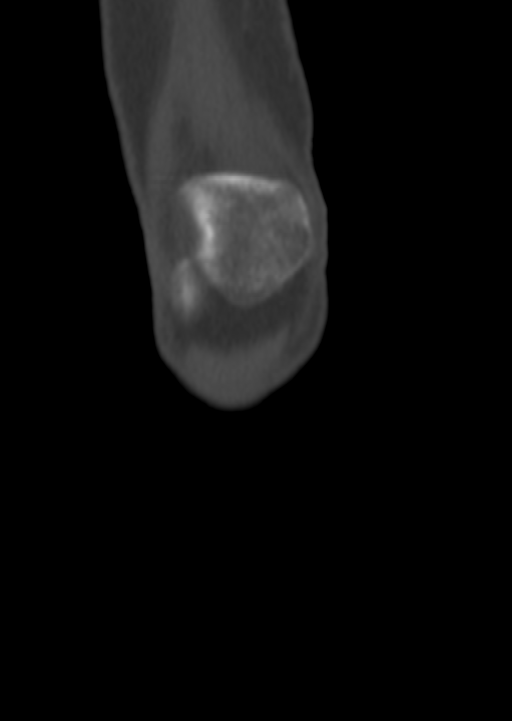
[im 27/68  bone]
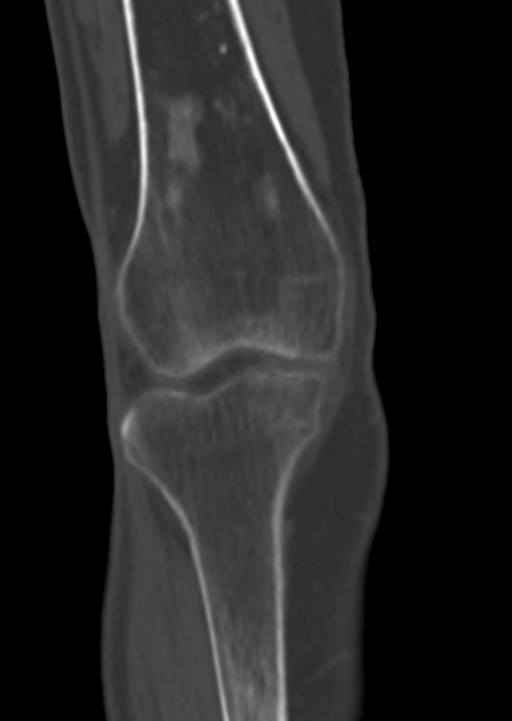
[im 41/68  bone]
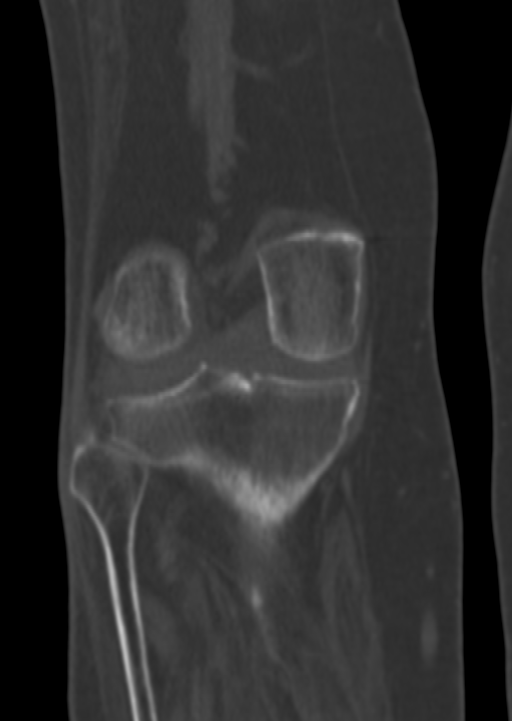

[Series 8: sagittal images · sagittal · 0.29mm/px · 5 of 61 slices shown, 6 images]
[im 21/61  bone]
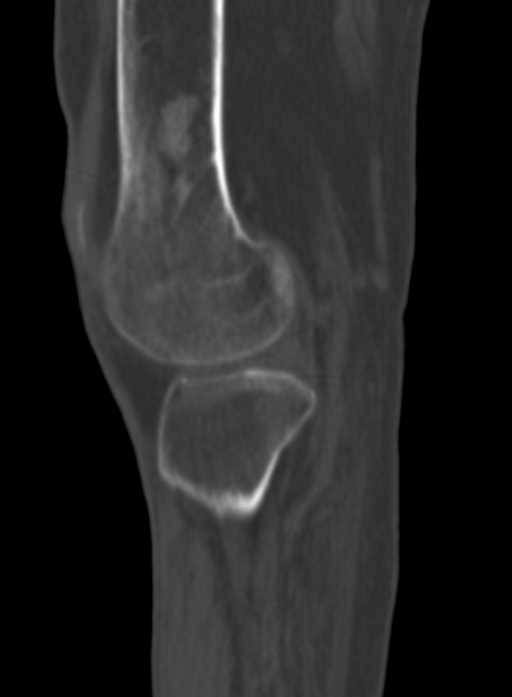
[im 26/61  bone]
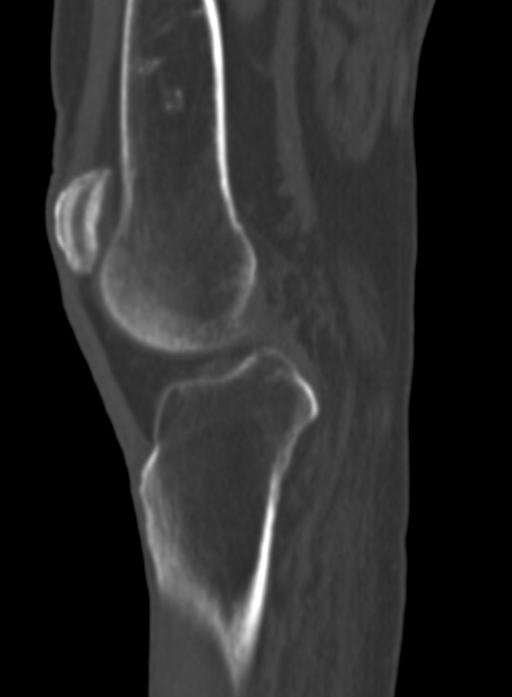
[im 31/61  soft-tissue]
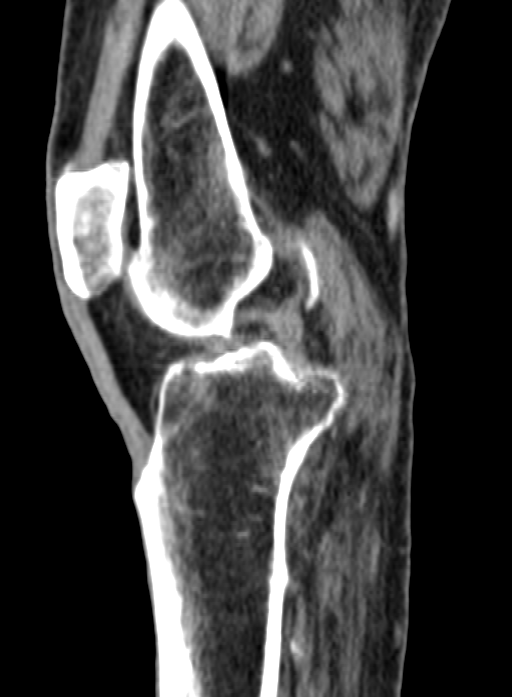
[im 31/61  bone]
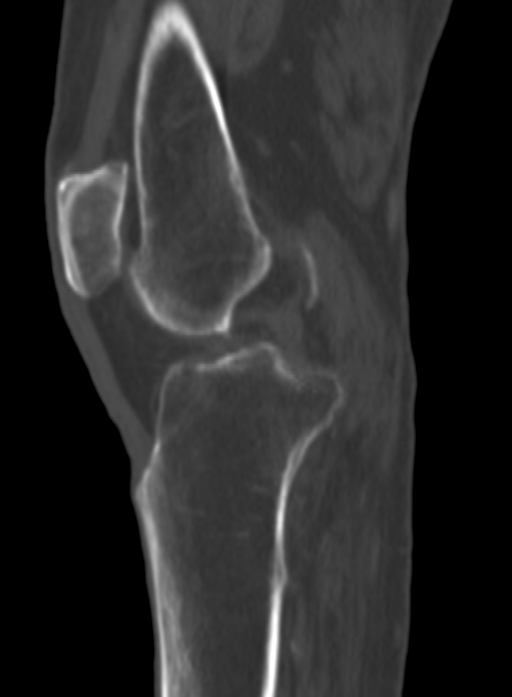
[im 36/61  bone]
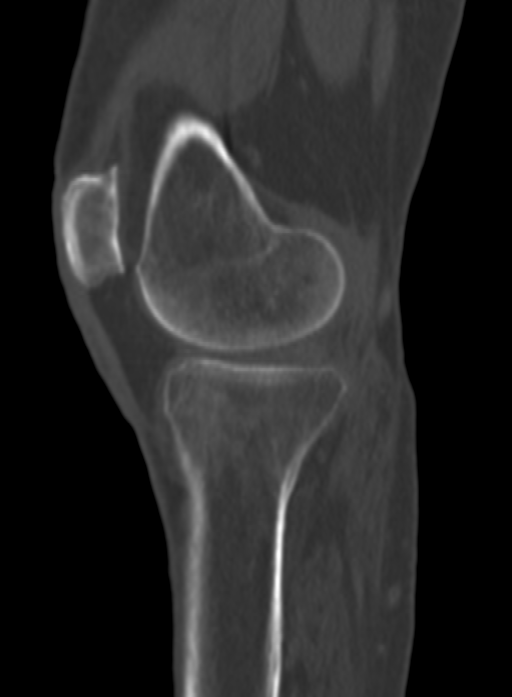
[im 41/61  bone]
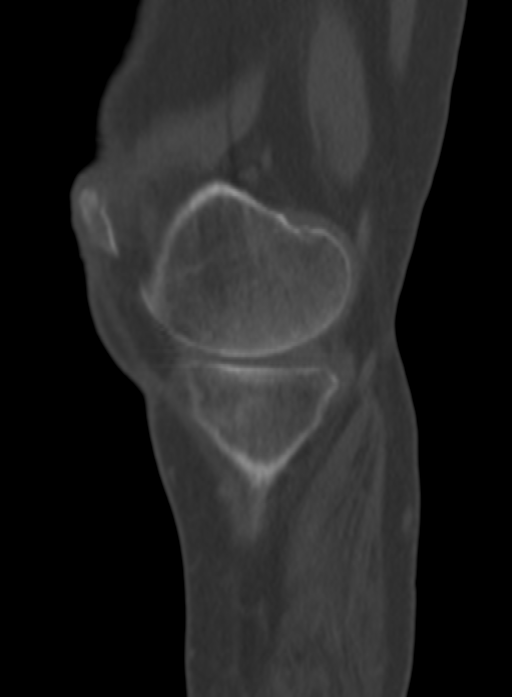

[14 of 33 positions shown; findings below may reference images not displayed]

FINDINGS: Bones/Joint/Cartilage

No fracture in the distal femur or proximal tibia/ fibula. The
patella is intact. No joint effusion. Loss of joint space is noted
in the medial compartment and to a lesser degree in the lateral
compartment.

Ligaments

Suboptimally assessed by CT.

Muscles and Tendons

Muscles about the knee appear atrophic.

Soft tissues

No evidence for soft tissue hematoma.
IMPRESSION: 1. No fracture evident by CT in this osteopenic patient. No joint
effusion.
2. Follow-up MRI of the knee may prove helpful to further evaluate
as bone contusion and ligamentous/meniscal injuries can be occult on
CT.

## 2018-11-17 IMAGING — DX DG HIP (WITH OR WITHOUT PELVIS) 2-3V*L*
3 series · 3 of 3 positions shown · non-contrast
Comparison: 09/13/2016

CLINICAL DATA: Left hip pain after a fall.

EXAM:
DG HIP (WITH OR WITHOUT PELVIS) 2-3V LEFT

[pelvis ap]
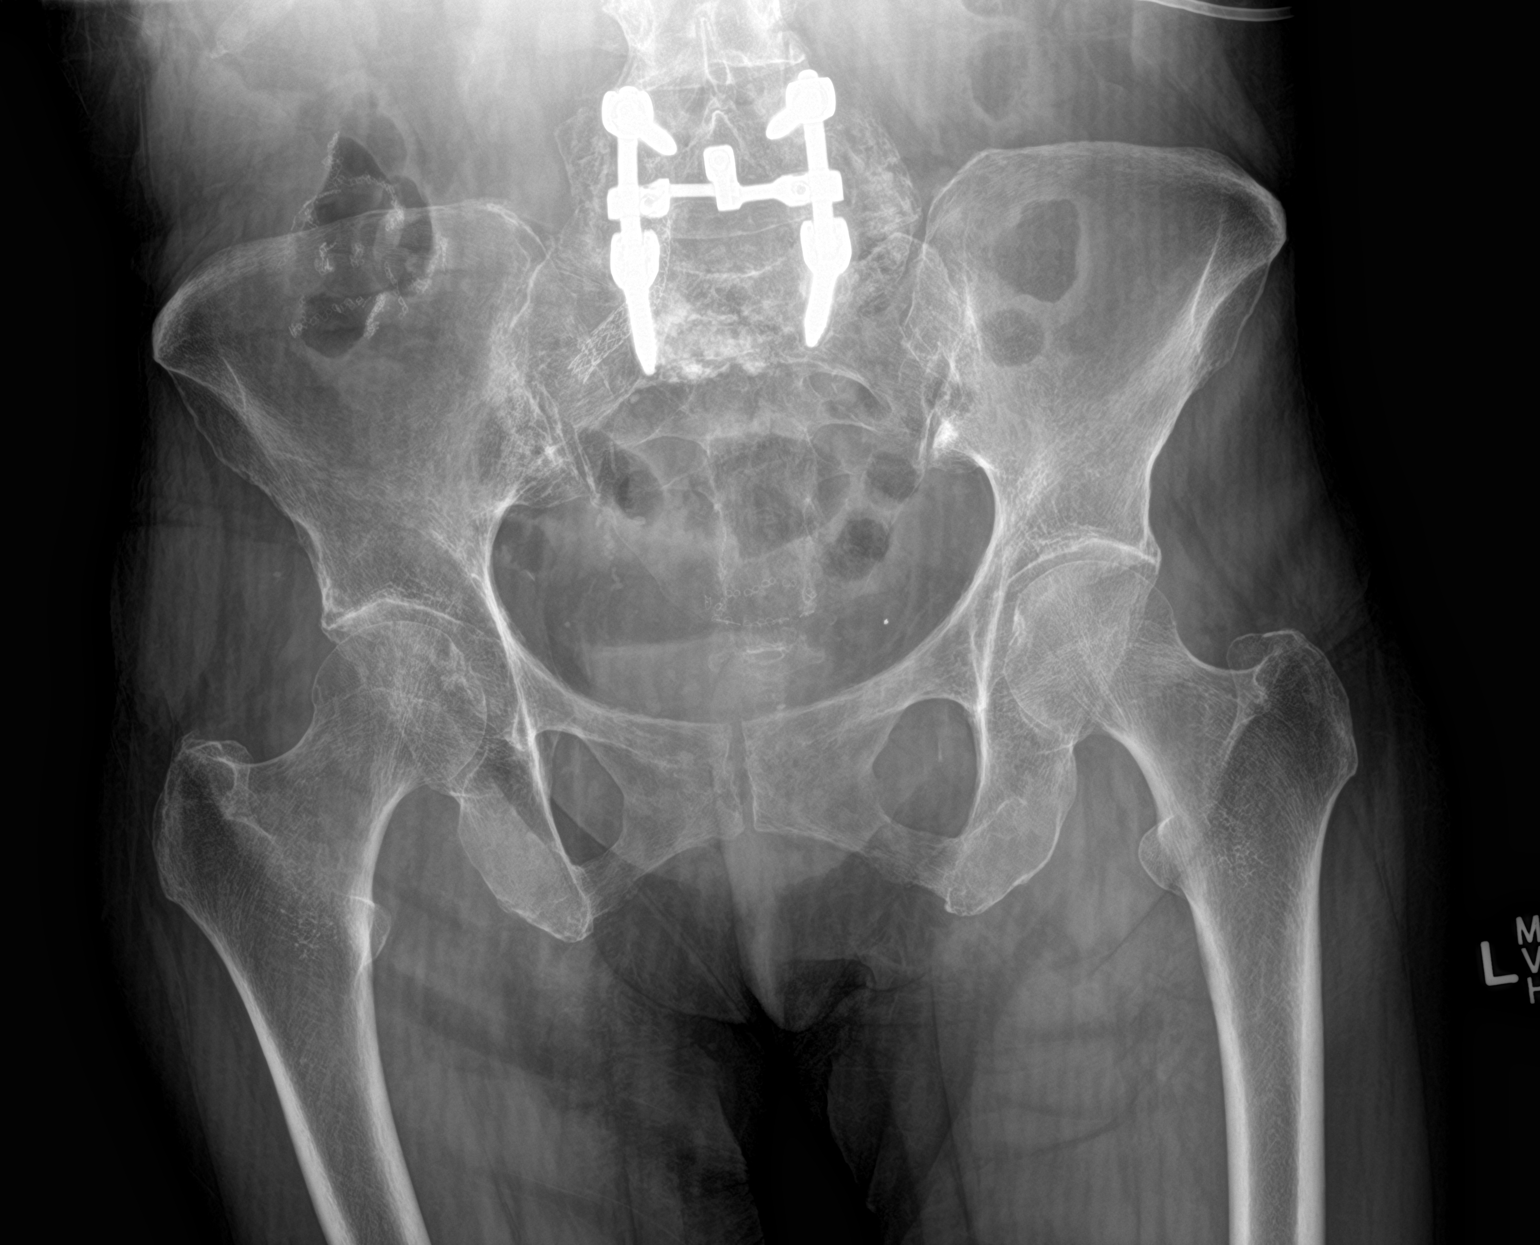

[hip ap]
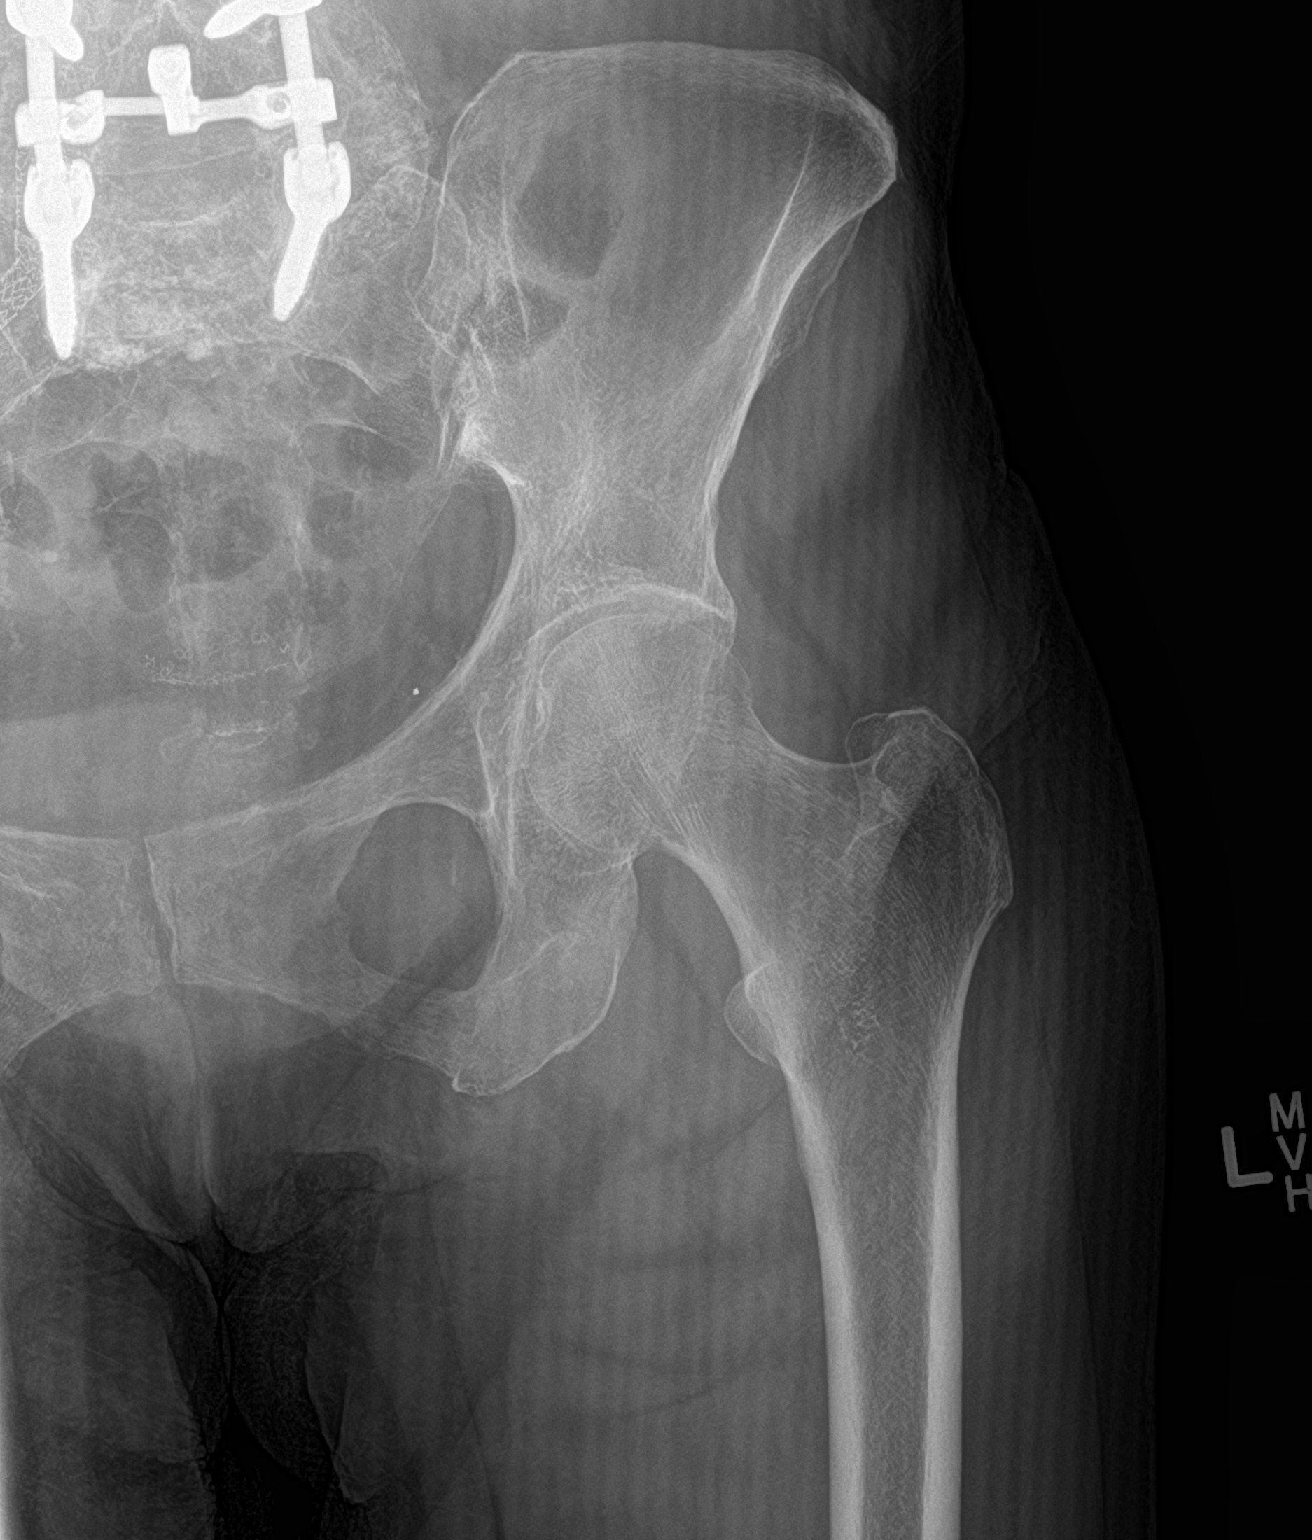

[hip x-table]
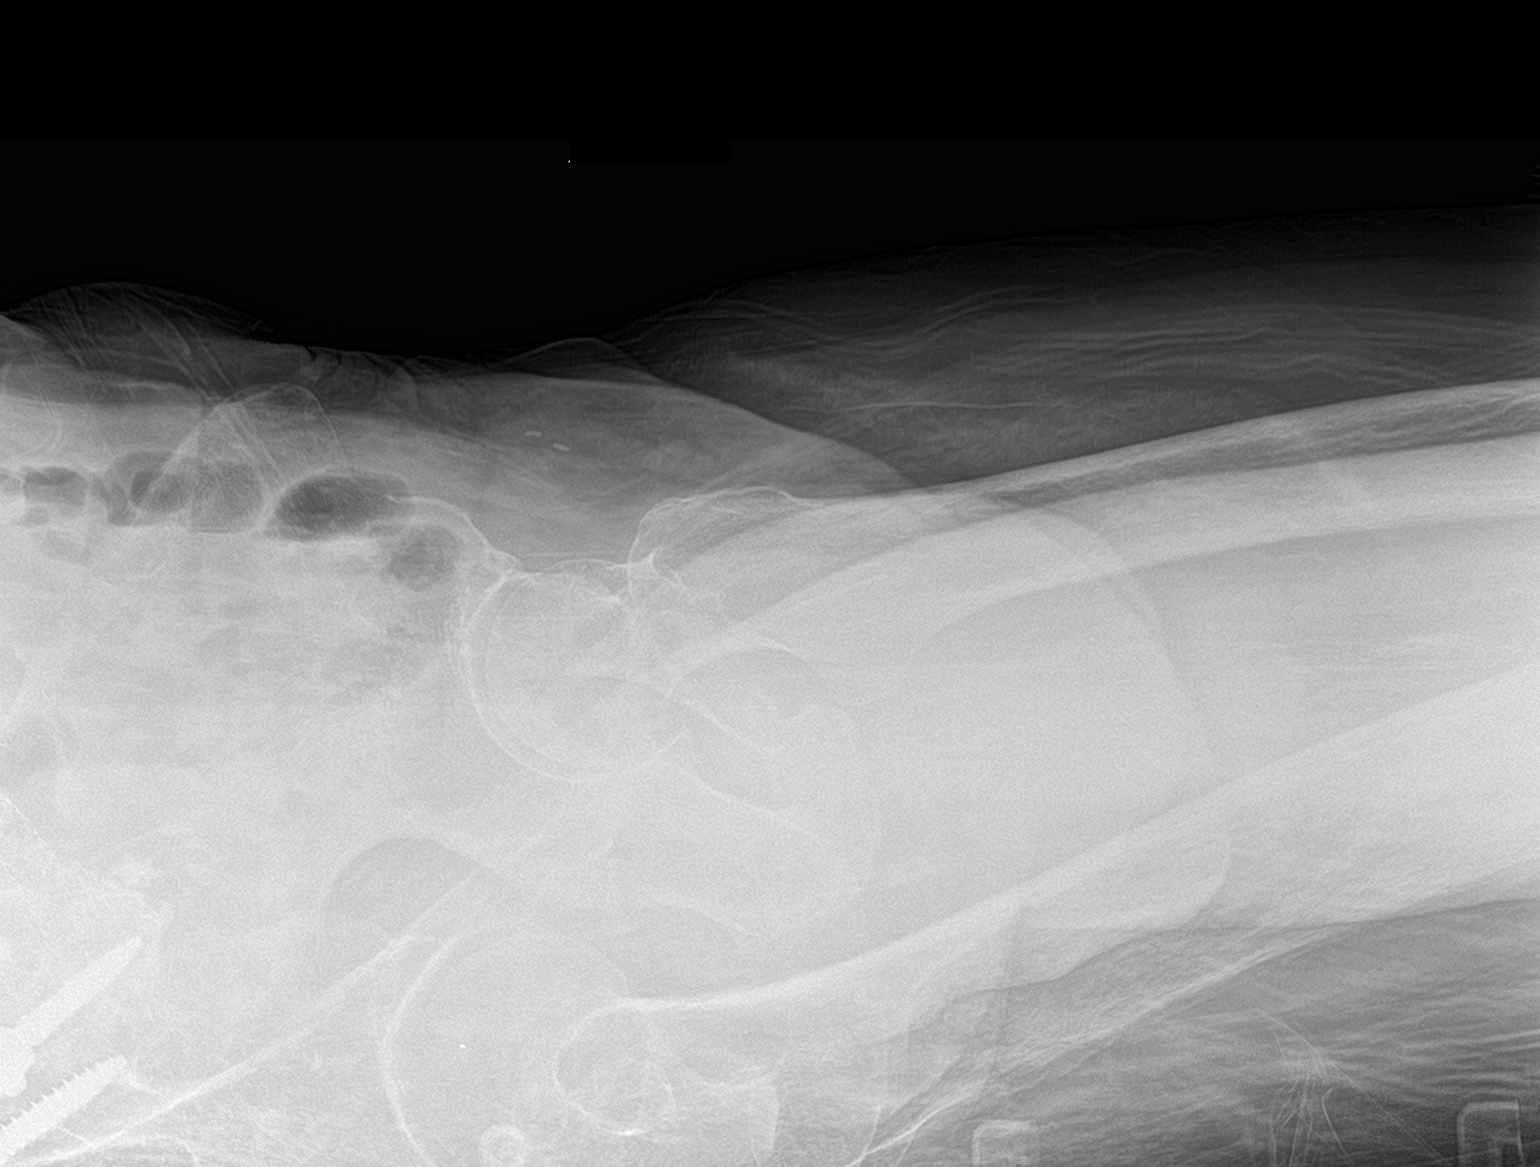

[3 of 3 positions shown; findings below may reference images not displayed]

FINDINGS: AP view the pelvis and AP/lateral views of the left hip. Lumbosacral
spine fixation. Sacroiliac joints are symmetric. Mild osteopenia.
Femoral heads are located. No acute fracture.
IMPRESSION: No acute osseous abnormality.
